# Patient Record
Sex: Male | Born: 1965
Health system: Southern US, Community
[De-identification: ages and names within clinical notes are randomized; demographics above are authoritative.]

## PROBLEM LIST (undated history)

## (undated) DIAGNOSIS — R569 Unspecified convulsions: Secondary | ICD-10-CM

## (undated) DIAGNOSIS — M1990 Unspecified osteoarthritis, unspecified site: Secondary | ICD-10-CM

## (undated) DIAGNOSIS — J45909 Unspecified asthma, uncomplicated: Secondary | ICD-10-CM

## (undated) DIAGNOSIS — E7800 Pure hypercholesterolemia, unspecified: Secondary | ICD-10-CM

## (undated) DIAGNOSIS — I10 Essential (primary) hypertension: Secondary | ICD-10-CM

## (undated) HISTORY — PX: BIOPSY THYROID: PRO38

## (undated) HISTORY — PX: FINGER AMPUTATION: SHX636

---

## 2002-11-05 ENCOUNTER — Emergency Department (HOSPITAL_COMMUNITY): Admission: EM | Admit: 2002-11-05 | Discharge: 2002-11-05 | Payer: Self-pay

## 2003-01-25 ENCOUNTER — Emergency Department (HOSPITAL_COMMUNITY): Admission: EM | Admit: 2003-01-25 | Discharge: 2003-01-25 | Payer: Self-pay | Admitting: *Deleted

## 2003-08-03 ENCOUNTER — Emergency Department (HOSPITAL_COMMUNITY): Admission: EM | Admit: 2003-08-03 | Discharge: 2003-08-03 | Payer: Self-pay | Admitting: Emergency Medicine

## 2005-04-29 ENCOUNTER — Emergency Department (HOSPITAL_COMMUNITY): Admission: EM | Admit: 2005-04-29 | Discharge: 2005-04-29 | Payer: Self-pay | Admitting: Emergency Medicine

## 2010-11-29 ENCOUNTER — Ambulatory Visit (INDEPENDENT_AMBULATORY_CARE_PROVIDER_SITE_OTHER): Payer: BC Managed Care – PPO | Admitting: Medical

## 2010-11-29 DIAGNOSIS — F17200 Nicotine dependence, unspecified, uncomplicated: Secondary | ICD-10-CM

## 2010-11-29 DIAGNOSIS — IMO0001 Reserved for inherently not codable concepts without codable children: Secondary | ICD-10-CM

## 2010-11-29 DIAGNOSIS — I10 Essential (primary) hypertension: Secondary | ICD-10-CM

## 2010-12-03 ENCOUNTER — Encounter: Payer: Self-pay | Admitting: Medical

## 2010-12-03 ENCOUNTER — Telehealth: Payer: Self-pay | Admitting: *Deleted

## 2010-12-03 NOTE — Telephone Encounter (Signed)
Pt notified of lab results and is scheduled to return on 5-11.2012 for follow up.  CM, LPN

## 2010-12-03 NOTE — Telephone Encounter (Signed)
Message copied by Ellsworth Lennox on Tue Dec 03, 2010 12:30 PM ------      Message from: [place], DAVID      Created: Tue Dec 03, 2010  8:56 AM       Labs look fine, blood count, liver, kidney, lytes, thyroid ok.  Cholesterol overall is ok with some minor abnormalities.  Lets see him back as planned in 1wk for recheck on BP, new BP medication, and discuss results.

## 2010-12-04 ENCOUNTER — Encounter: Payer: Self-pay | Admitting: Medical

## 2010-12-05 ENCOUNTER — Encounter: Payer: Self-pay | Admitting: Medical

## 2010-12-06 ENCOUNTER — Ambulatory Visit: Payer: BC Managed Care – PPO | Admitting: Medical

## 2011-12-17 ENCOUNTER — Emergency Department (HOSPITAL_COMMUNITY)
Admission: EM | Admit: 2011-12-17 | Discharge: 2011-12-18 | Disposition: A | Discharge status: Home / Self Care | Payer: BC Managed Care – PPO | Attending: Emergency Medicine | Admitting: Emergency Medicine

## 2011-12-17 ENCOUNTER — Encounter (HOSPITAL_COMMUNITY): Payer: Self-pay | Admitting: *Deleted

## 2011-12-17 DIAGNOSIS — F17200 Nicotine dependence, unspecified, uncomplicated: Secondary | ICD-10-CM | POA: Insufficient documentation

## 2011-12-17 DIAGNOSIS — M542 Cervicalgia: Secondary | ICD-10-CM | POA: Insufficient documentation

## 2011-12-17 DIAGNOSIS — M25519 Pain in unspecified shoulder: Secondary | ICD-10-CM | POA: Insufficient documentation

## 2011-12-17 DIAGNOSIS — S68118A Complete traumatic metacarpophalangeal amputation of other finger, initial encounter: Secondary | ICD-10-CM | POA: Insufficient documentation

## 2011-12-17 DIAGNOSIS — M79609 Pain in unspecified limb: Secondary | ICD-10-CM | POA: Insufficient documentation

## 2011-12-17 DIAGNOSIS — M546 Pain in thoracic spine: Secondary | ICD-10-CM | POA: Insufficient documentation

## 2011-12-17 MED ORDER — LIDOCAINE 5 % EX PTCH
1.0000 | MEDICATED_PATCH | CUTANEOUS | Status: DC
  Administered 2011-12-18: 1 via TRANSDERMAL
  Filled 2011-12-17: qty 1

## 2011-12-17 MED ORDER — TRAMADOL HCL 50 MG PO TABS
50.0000 mg | ORAL_TABLET | Freq: Once | ORAL | Status: AC
  Administered 2011-12-17: 50 mg via ORAL
  Filled 2011-12-17: qty 1

## 2011-12-17 NOTE — ED Notes (Signed)
C/o R neck and shoulder pain, nagging, constant, worse with movement, (denies: sob, numbness/ tingling, weakness, nvd, fever or other sx). Onset Sunday. "Not getting better, but not worse", no relief with advil or ASA.   No known injury.

## 2011-12-18 ENCOUNTER — Emergency Department (HOSPITAL_COMMUNITY): Payer: BC Managed Care – PPO

## 2011-12-18 MED ORDER — TRAMADOL HCL 50 MG PO TABS: 50.0000 mg | ORAL_TABLET | Freq: Once | ORAL | Status: AC

## 2011-12-18 MED ORDER — LIDOCAINE 5 % EX PTCH: 1.0000 | MEDICATED_PATCH | CUTANEOUS | Status: AC

## 2011-12-18 NOTE — Discharge Instructions (Signed)
Your pain is very nonspecific.  Your x-ray is normal.  You have been given Ultram by mouth every 6 hours as well as Lidoderm patches to apply 1 every 24 hours.  Followup with your doctor for further evaluation

## 2011-12-18 NOTE — ED Provider Notes (Signed)
History     CSN: [id number]  Arrival date & time 12/17/11  2250   First MD Initiated Contact with Patient 12/17/11 2359      Chief Complaint  Patient presents with  . Neck Pain    (Consider location/radiation/quality/duration/timing/severity/associated sxs/prior treatment) HPI Comments: Is doing very vague complaints of right shoulder and neck arm pain, he cannot turn you specifically where this pain is, but it's stabbing in nature, and constant, can not pinpoint where the pain is, denies any trauma, recent illnesses, MVC, shortness of breath, chest pain, diaphoresis, nausea  Patient is a 46 y.o. male presenting with neck pain. The history is provided by the patient.  Neck Pain  This is a new problem. The current episode started more than 2 days ago. The problem occurs constantly. The problem has not changed since onset.Pertinent negatives include no numbness, no headaches and no weakness.    History reviewed. No pertinent past medical history.  Past Surgical History  Procedure Date  . Finger amputation     No family history on file.  History  Substance Use Topics  . Smoking status: Current Everyday Smoker -- 0.5 packs/day  . Smokeless tobacco: Not on file  . Alcohol Use: Yes     beer occasionally      Review of Systems  Constitutional: Negative for fever and chills.  HENT: Positive for neck pain. Negative for ear pain, congestion, sore throat, rhinorrhea, trouble swallowing, neck stiffness and ear discharge.   Respiratory: Negative for shortness of breath.   Gastrointestinal: Negative for nausea.  Musculoskeletal: Negative for myalgias and back pain.  Neurological: Negative for dizziness, weakness, numbness and headaches.    Allergies  Review of patient's allergies indicates no known allergies.  Home Medications   Current Outpatient Rx  Name Route Sig Dispense Refill  . ASPIRIN EC 81 MG PO TBEC Oral Take 81-162 mg by mouth 3 (three) times daily as needed. For  pain    . LIDOCAINE 5 % EX PTCH Transdermal Place 1 patch onto the skin daily. Remove & Discard patch within 12 hours or as directed by MD 10 patch 0  . TRAMADOL HCL 50 MG PO TABS Oral Take 1 tablet (50 mg total) by mouth once. 30 tablet 0    BP 151/99  Pulse 62  Temp(Src) 97.6 F (36.4 C) (Oral)  Resp 19  SpO2 90%  Physical Exam  Constitutional: He is oriented to person, place, and time. He appears well-developed and well-nourished.  HENT:  Head: Normocephalic.  Eyes: Pupils are equal, round, and reactive to light.  Neck: Normal range of motion. Neck supple. No spinous process tenderness and no muscular tenderness present. No rigidity. No edema, no erythema and normal range of motion present.  Cardiovascular: Normal rate.   Pulmonary/Chest: Effort normal.  Musculoskeletal: Normal range of motion.       Is no rash.  There are no lesions in is no discoloration.  Patient has no swelling to the neck, shoulder, back, and right arm.  Pulses are strong distally, regular.  Full range of motion at the shoulder, elbow and wrist.  Can turn head from side to side without eliciting pain  Neurological: He is alert and oriented to person, place, and time.  Skin: Skin is warm.    ED Course  Procedures (including critical care time)  Labs Reviewed - No data to display Dg Thoracic Spine 2 View  12/18/2011  *RADIOLOGY REPORT*  Clinical Data: Upper thoracic pain  THORACIC SPINE -  2 VIEW  Comparison: None.  Findings: The imaged vertebral bodies and inter-vertebral disc spaces are maintained. No displaced acute fracture or dislocation identified.   The para-vertebral and overlying soft tissues are within normal limits.  Visualized portions of the lungs are clear.  IMPRESSION: No acute osseous abnormality identified of the thoracic spine.  Original Report Authenticated By: Waneta Martins, M.D.     1. Shoulder pain, unspecified laterality       MDM  Patient is having vague nonreproducible pain  on the right side of his neck, maybe the top of his shoulder        Arman Filter, NP 12/18/11 1191

## 2011-12-21 NOTE — ED Provider Notes (Signed)
Medical screening examination/treatment/procedure(s) were performed by non-physician practitioner and as supervising physician I was immediately available for consultation/collaboration.   Laray Anger, DO 12/21/11 1225

## 2012-01-19 ENCOUNTER — Encounter (HOSPITAL_COMMUNITY): Payer: Self-pay | Admitting: *Deleted

## 2012-01-19 ENCOUNTER — Emergency Department (HOSPITAL_COMMUNITY)
Admission: EM | Admit: 2012-01-19 | Discharge: 2012-01-19 | Disposition: A | Discharge status: Home / Self Care | Payer: BC Managed Care – PPO | Attending: Emergency Medicine | Admitting: Emergency Medicine

## 2012-01-19 DIAGNOSIS — F17200 Nicotine dependence, unspecified, uncomplicated: Secondary | ICD-10-CM | POA: Insufficient documentation

## 2012-01-19 DIAGNOSIS — R51 Headache: Secondary | ICD-10-CM

## 2012-01-19 HISTORY — DX: Essential (primary) hypertension: I10

## 2012-01-19 MED ORDER — HYDROCODONE-ACETAMINOPHEN 5-325 MG PO TABS: 1.0000 | ORAL_TABLET | ORAL | Status: AC | PRN

## 2012-01-19 NOTE — ED Notes (Signed)
Pt to ED c/o increasing frequency, but not intensity, of her headaches in the last few months.  Pt denies nausea, but states he is photophobic.

## 2012-01-19 NOTE — ED Provider Notes (Signed)
History     CSN: [id number]  Arrival date & time 01/19/12  0129   First MD Initiated Contact with Patient 01/19/12 0208      Chief Complaint  Patient presents with  . Headache    GENERALIZED HEAD    (Consider location/radiation/quality/duration/timing/severity/associated sxs/prior treatment) HPI Comments: Patient who had been drinking earlier in the evening presents tonight with headache - he states that the headache started about 9pm - states that it is global and not associated with blurred vision, nausea, vomiting, fever, chills, neck stiffness, syncope, weakness, numbness or tingling.  He states that he has tried motrin for the pain without relief.  Patient is a 46 y.o. male presenting with headaches. The history is provided by the patient. No language interpreter was used.  Headache  This is a new problem. The current episode started 6 to 12 hours ago. The problem occurs constantly. The problem has not changed since onset.The headache is associated with nothing. Pain location: diffuse. The quality of the pain is described as throbbing. The pain is at a severity of 5/10. The pain is moderate. The pain does not radiate. Pertinent negatives include no anorexia, no fever, no malaise/fatigue, no chest pressure, no near-syncope, no orthopnea, no palpitations, no syncope, no shortness of breath, no nausea and no vomiting. He has tried NSAIDs for the symptoms. The treatment provided no relief.    Past Medical History  Diagnosis Date  . Hypertension     Past Surgical History  Procedure Date  . Finger amputation     No family history on file.  History  Substance Use Topics  . Smoking status: Current Everyday Smoker -- 0.5 packs/day  . Smokeless tobacco: Not on file  . Alcohol Use: Yes     beer occasionally      Review of Systems  Constitutional: Negative for fever, chills and malaise/fatigue.  HENT: Negative for ear pain, neck pain and neck stiffness.   Eyes: Negative for  photophobia, pain and visual disturbance.  Respiratory: Negative for chest tightness and shortness of breath.   Cardiovascular: Negative for chest pain, palpitations, orthopnea, syncope and near-syncope.  Gastrointestinal: Negative for nausea, vomiting and anorexia.  Genitourinary: Negative for dysuria and flank pain.  Musculoskeletal: Negative for back pain.  Neurological: Positive for headaches. Negative for dizziness, tremors, syncope and numbness.  All other systems reviewed and are negative.    Allergies  Review of patient's allergies indicates no known allergies.  Home Medications   Current Outpatient Rx  Name Route Sig Dispense Refill  . IBUPROFEN 200 MG PO TABS Oral Take 400 mg by mouth every 6 (six) hours as needed. For pain      BP 123/74  Pulse 67  Temp 97.7 F (36.5 C) (Oral)  Resp 20  SpO2 97%  Physical Exam  Nursing note and vitals reviewed. Constitutional: He is oriented to person, place, and time. He appears well-developed and well-nourished. No distress.  HENT:  Head: Normocephalic and atraumatic.  Right Ear: External ear normal.  Left Ear: External ear normal.  Nose: Nose normal.  Mouth/Throat: Oropharynx is clear and moist. No oropharyngeal exudate.  Eyes: Conjunctivae are normal. Pupils are equal, round, and reactive to light. No scleral icterus.  Neck: Normal range of motion. Neck supple. No Brudzinski's sign and no Kernig's sign noted.  Cardiovascular: Normal rate, regular rhythm and normal heart sounds.  Exam reveals no gallop and no friction rub.   No murmur heard. Pulmonary/Chest: Effort normal and breath sounds normal. No  respiratory distress. He has no wheezes. He has no rales. He exhibits no tenderness.  Abdominal: Soft. Bowel sounds are normal. He exhibits no distension. There is no tenderness.  Musculoskeletal: Normal range of motion. He exhibits no edema and no tenderness.  Lymphadenopathy:    He has no cervical adenopathy.  Neurological:  He is alert and oriented to person, place, and time. He has normal strength and normal reflexes. He displays no tremor and normal reflexes. No cranial nerve deficit or sensory deficit. He exhibits normal muscle tone. He displays a negative Romberg sign. Coordination and gait normal. GCS eye subscore is 4. GCS verbal subscore is 5. GCS motor subscore is 6.  Skin: Skin is warm and dry. No rash noted. No erythema. No pallor.  Psychiatric: He has a normal mood and affect. His behavior is normal. Judgment and thought content normal.    ED Course  Procedures (including critical care time)  Labs Reviewed - No data to display No results found.   Headache   MDM  Patient here with vague global headache - there is no neurological deficit noted, and I do not feel that imaging is necessary - completely normal exam - blood pressure mildly elevated and patient just went to optomotrist - no eye pain, I doubt acute glaucoma, migraine headache, meningitis.        Izola Price [place], [place] 01/19/12 [phone]

## 2012-01-19 NOTE — ED Provider Notes (Signed)
Medical screening examination/treatment/procedure(s) were performed by non-physician practitioner and as supervising physician I was immediately available for consultation/collaboration.  Jasmine Awe, MD 01/19/12 [phone]

## 2012-01-19 NOTE — Discharge Instructions (Signed)
Headaches, Frequently Asked Questions MIGRAINE HEADACHES Q: What is migraine? What causes it? How can I treat it? A: Generally, migraine headaches begin as a dull ache. Then they develop into a constant, throbbing, and pulsating pain. You may experience pain at the temples. You may experience pain at the front or back of one or both sides of the head. The pain is usually accompanied by a combination of:  Nausea.   Vomiting.   Sensitivity to light and noise.  Some people (about 15%) experience an aura (see below) before an attack. The cause of migraine is believed to be chemical reactions in the brain. Treatment for migraine may include over-the-counter or prescription medications. It may also include self-help techniques. These include relaxation training and biofeedback.  Q: What is an aura? A: About 15% of people with migraine get an "aura". This is a sign of neurological symptoms that occur before a migraine headache. You may see wavy or jagged lines, dots, or flashing lights. You might experience tunnel vision or blind spots in one or both eyes. The aura can include visual or auditory hallucinations (something imagined). It may include disruptions in smell (such as strange odors), taste or touch. Other symptoms include:  Numbness.   A "pins and needles" sensation.   Difficulty in recalling or speaking the correct word.  These neurological events may last as long as 60 minutes. These symptoms will fade as the headache begins. Q: What is a trigger? A: Certain physical or environmental factors can lead to or "trigger" a migraine. These include:  Foods.   Hormonal changes.   Weather.   Stress.  It is important to remember that triggers are different for everyone. To help prevent migraine attacks, you need to figure out which triggers affect you. Keep a headache diary. This is a good way to track triggers. The diary will help you talk to your healthcare professional about your  condition. Q: Does weather affect migraines? A: Bright sunshine, hot, humid conditions, and drastic changes in barometric pressure may lead to, or "trigger," a migraine attack in some people. But studies have shown that weather does not act as a trigger for everyone with migraines. Q: What is the link between migraine and hormones? A: Hormones start and regulate many of your body's functions. Hormones keep your body in balance within a constantly changing environment. The levels of hormones in your body are unbalanced at times. Examples are during menstruation, pregnancy, or menopause. That can lead to a migraine attack. In fact, about three quarters of all women with migraine report that their attacks are related to the menstrual cycle.  Q: Is there an increased risk of stroke for migraine sufferers? A: The likelihood of a migraine attack causing a stroke is very remote. That is not to say that migraine sufferers cannot have a stroke associated with their migraines. In persons under age 40, the most common associated factor for stroke is migraine headache. But over the course of a person's normal life span, the occurrence of migraine headache may actually be associated with a reduced risk of dying from cerebrovascular disease due to stroke.  Q: What are acute medications for migraine? A: Acute medications are used to treat the pain of the headache after it has started. Examples over-the-counter medications, NSAIDs, ergots, and triptans.  Q: What are the triptans? A: Triptans are the newest class of abortive medications. They are specifically targeted to treat migraine. Triptans are vasoconstrictors. They moderate some chemical reactions in the brain.   The triptans work on receptors in your brain. Triptans help to restore the balance of a neurotransmitter called serotonin. Fluctuations in levels of serotonin are thought to be a main cause of migraine.  Q: Are over-the-counter medications for migraine  effective? A: Over-the-counter, or "OTC," medications may be effective in relieving mild to moderate pain and associated symptoms of migraine. But you should see your caregiver before beginning any treatment regimen for migraine.  Q: What are preventive medications for migraine? A: Preventive medications for migraine are sometimes referred to as "prophylactic" treatments. They are used to reduce the frequency, severity, and length of migraine attacks. Examples of preventive medications include antiepileptic medications, antidepressants, beta-blockers, calcium channel blockers, and NSAIDs (nonsteroidal anti-inflammatory drugs). Q: Why are anticonvulsants used to treat migraine? A: During the past few years, there has been an increased interest in antiepileptic drugs for the prevention of migraine. They are sometimes referred to as "anticonvulsants". Both epilepsy and migraine may be caused by similar reactions in the brain.  Q: Why are antidepressants used to treat migraine? A: Antidepressants are typically used to treat people with depression. They may reduce migraine frequency by regulating chemical levels, such as serotonin, in the brain.  Q: What alternative therapies are used to treat migraine? A: The term "alternative therapies" is often used to describe treatments considered outside the scope of conventional Western medicine. Examples of alternative therapy include acupuncture, acupressure, and yoga. Another common alternative treatment is herbal therapy. Some herbs are believed to relieve headache pain. Always discuss alternative therapies with your caregiver before proceeding. Some herbal products contain arsenic and other toxins. TENSION HEADACHES Q: What is a tension-type headache? What causes it? How can I treat it? A: Tension-type headaches occur randomly. They are often the result of temporary stress, anxiety, fatigue, or anger. Symptoms include soreness in your temples, a tightening  band-like sensation around your head (a "vice-like" ache). Symptoms can also include a pulling feeling, pressure sensations, and contracting head and neck muscles. The headache begins in your forehead, temples, or the back of your head and neck. Treatment for tension-type headache may include over-the-counter or prescription medications. Treatment may also include self-help techniques such as relaxation training and biofeedback. CLUSTER HEADACHES Q: What is a cluster headache? What causes it? How can I treat it? A: Cluster headache gets its name because the attacks come in groups. The pain arrives with little, if any, warning. It is usually on one side of the head. A tearing or bloodshot eye and a runny nose on the same side of the headache may also accompany the pain. Cluster headaches are believed to be caused by chemical reactions in the brain. They have been described as the most severe and intense of any headache type. Treatment for cluster headache includes prescription medication and oxygen. SINUS HEADACHES Q: What is a sinus headache? What causes it? How can I treat it? A: When a cavity in the bones of the face and skull (a sinus) becomes inflamed, the inflammation will cause localized pain. This condition is usually the result of an allergic reaction, a tumor, or an infection. If your headache is caused by a sinus blockage, such as an infection, you will probably have a fever. An x-ray will confirm a sinus blockage. Your caregiver's treatment might include antibiotics for the infection, as well as antihistamines or decongestants.  REBOUND HEADACHES Q: What is a rebound headache? What causes it? How can I treat it? A: A pattern of taking acute headache medications too   often can lead to a condition known as "rebound headache." A pattern of taking too much headache medication includes taking it more than 2 days per week or in excessive amounts. That means more than the label or a caregiver advises.  With rebound headaches, your medications not only stop relieving pain, they actually begin to cause headaches. Doctors treat rebound headache by tapering the medication that is being overused. Sometimes your caregiver will gradually substitute a different type of treatment or medication. Stopping may be a challenge. Regularly overusing a medication increases the potential for serious side effects. Consult a caregiver if you regularly use headache medications more than 2 days per week or more than the label advises. ADDITIONAL QUESTIONS AND ANSWERS Q: What is biofeedback? A: Biofeedback is a self-help treatment. Biofeedback uses special equipment to monitor your body's involuntary physical responses. Biofeedback monitors:  Breathing.   Pulse.   Heart rate.   Temperature.   Muscle tension.   Brain activity.  Biofeedback helps you refine and perfect your relaxation exercises. You learn to control the physical responses that are related to stress. Once the technique has been mastered, you do not need the equipment any more. Q: Are headaches hereditary? A: Four out of five (80%) of people that suffer report a family history of migraine. Scientists are not sure if this is genetic or a family predisposition. Despite the uncertainty, a child has a 50% chance of having migraine if one parent suffers. The child has a 75% chance if both parents suffer.  Q: Can children get headaches? A: By the time they reach high school, most young people have experienced some type of headache. Many safe and effective approaches or medications can prevent a headache from occurring or stop it after it has begun.  Q: What type of doctor should I see to diagnose and treat my headache? A: Start with your primary caregiver. Discuss his or her experience and approach to headaches. Discuss methods of classification, diagnosis, and treatment. Your caregiver may decide to recommend you to a headache specialist, depending upon  your symptoms or other physical conditions. Having diabetes, allergies, etc., may require a more comprehensive and inclusive approach to your headache. The National Headache Foundation will provide, upon request, a list of [facility] physician members in your state. Document Released: 10/04/2003 Document Revised: 07/03/2011 Document Reviewed: 03/13/2008 [facility] Patient Information 2012 [place], [place].Headache, General, Unknown Cause The specific cause of your headache may not have been found today. There are many causes and types of headache. A few common ones are:  Tension headache.   Migraine.   Infections (examples: dental and sinus infections).   Bone and/or joint problems in the neck or jaw.   Depression.   Eye problems.  These headaches are not life threatening.  Headaches can sometimes be diagnosed by a patient history and a physical exam. Sometimes, lab and imaging studies (such as x-ray and/or CT scan) are used to rule out more serious problems. In some cases, a spinal tap (lumbar puncture) may be requested. There are many times when your exam and tests may be normal on the first visit even when there is a serious problem causing your headaches. Because of that, it is very important to follow up with your doctor or local clinic for further evaluation. FINDING OUT THE RESULTS OF TESTS  If a radiology test was performed, a radiologist will review your results.   You will be contacted by the emergency department or your physician if any test results  require a change in your treatment plan.   Not all test results may be available during your visit. If your test results are not back during the visit, make an appointment with your caregiver to find out the results. Do not assume everything is normal if you have not heard from your caregiver or the medical facility. It is important for you to follow up on all of your test results.  HOME CARE INSTRUCTIONS   Keep follow-up appointments with  your caregiver, or any specialist referral.   Only take over-the-counter or prescription medicines for pain, discomfort, or fever as directed by your caregiver.   Biofeedback, massage, or other relaxation techniques may be helpful.   Ice packs or heat applied to the head and neck can be used. Do this three to four times per day, or as needed.   Call your doctor if you have any questions or concerns.   If you smoke, you should quit.  SEEK MEDICAL CARE IF:   You develop problems with medications prescribed.   You do not respond to or obtain relief from medications.   You have a change from the usual headache.   You develop nausea or vomiting.  SEEK IMMEDIATE MEDICAL CARE IF:   If your headache becomes severe.   You have an unexplained oral temperature above 102 F (38.9 C), or as your caregiver suggests.   You have a stiff neck.   You have loss of vision.   You have muscular weakness.   You have loss of muscular control.   You develop severe symptoms different from your first symptoms.   You start losing your balance or have trouble walking.   You feel faint or pass out.  MAKE SURE YOU:   Understand these instructions.   Will watch your condition.   Will get help right away if you are not doing well or get worse.  Document Released: 07/14/2005 Document Revised: 07/03/2011 Document Reviewed: 03/02/2008 [facility] Patient Information 2012 [place], [place].

## 2012-01-19 NOTE — ED Notes (Addendum)
Pt reports headache that started last PM over generalized head denies blurred vision, nausea, or syncope neuro grossly intact

## 2012-03-04 ENCOUNTER — Ambulatory Visit (INDEPENDENT_AMBULATORY_CARE_PROVIDER_SITE_OTHER): Payer: BC Managed Care – PPO | Admitting: General Surgery

## 2012-03-17 ENCOUNTER — Encounter (INDEPENDENT_AMBULATORY_CARE_PROVIDER_SITE_OTHER): Payer: Self-pay | Admitting: General Surgery

## 2012-03-17 ENCOUNTER — Ambulatory Visit (INDEPENDENT_AMBULATORY_CARE_PROVIDER_SITE_OTHER): Payer: BC Managed Care – PPO | Admitting: General Surgery

## 2012-03-17 VITALS — BP 150/100 | HR 88 | Temp 98.3°F | Resp 16 | Ht 70.0 in | Wt 153.8 lb

## 2012-03-17 DIAGNOSIS — E042 Nontoxic multinodular goiter: Secondary | ICD-10-CM | POA: Insufficient documentation

## 2012-03-17 NOTE — Patient Instructions (Signed)
Thyroid Diseases Your thyroid is a butterfly-shaped gland in your neck. It is located just above your collarbone. It is one of your endocrine glands, which make hormones. The thyroid helps set your metabolism. Metabolism is how your body gets energy from the foods you eat.  Millions of people have thyroid diseases. Women experience thyroid problems more often than men. In fact, overactive thyroid problems (hyperthyroidism) occur in 1% of all women. If you have a thyroid disease, your body may use energy more slowly or quickly than it should.  Thyroid problems also include an immune disease where your body reacts against your thyroid gland (called thyroiditis). A different problem involves lumps and bumps (called nodules) that develop in the gland. The nodules are usually, but not always, noncancerous. THE MOST COMMON THYROID PROBLEMS AND CAUSES ARE DISCUSSED BELOW There are many causes for thyroid problems. Treatment depends upon the exact diagnosis and includes trying to reset your body's metabolism to a normal rate. Hyperthyroidism Too much thyroid hormone from an overactive thyroid gland is called hyperthyroidism. In hyperthyroidism, the body's metabolism speeds up. One of the most frequent forms of hyperthyroidism is known as Graves' disease. Graves' disease tends to run in families. Although Graves' is thought to be caused by a problem with the immune system, the exact nature of the genetic problem is unknown. Hypothyroidism Too little thyroid hormone from an underactive thyroid gland is called hypothyroidism. In hypothyroidism, the body's metabolism is slowed. Several things can cause this condition. Most causes affect the thyroid gland directly and hurt its ability to make enough hormone.  Rarely, there may be a pituitary gland tumor (located near the base of the brain). The tumor can block the pituitary from producing thyroid-stimulating hormone (TSH). Your body makes TSH to stimulate the thyroid  to work properly. If the pituitary does not make enough TSH, the thyroid fails to make enough hormones needed for good health. Whether the problem is caused by thyroid conditions or by the pituitary gland, the result is that the thyroid is not making enough hormones. Hypothyroidism causes many physical and mental processes to become sluggish. The body consumes less oxygen and produces less body heat. Thyroid Nodules A thyroid nodule is a small swelling or lump in the thyroid gland. They are common. These nodules represent either a growth of thyroid tissue or a fluid-filled cyst. Both form a lump in the thyroid gland. Almost half of all people will have tiny thyroid nodules at some point in their lives. Typically, these are not noticeable until they become large and affect normal thyroid size. Larger nodules that are greater than a half inch across (about 1 centimeter) occur in about 5 percent of people. Although most nodules are not cancerous, people who have them should seek medical care to rule out cancer. Also, some thyroid nodules may produce too much thyroid hormone or become too large. Large nodules or a large gland can interfere with breathing or swallowing or may cause neck discomfort. Other problems Other thyroid problems include cancer and thyroiditis. Thyroiditis is a malfunction of the body's immune system. Normally, the immune system works to defend the body against infection and other problems. When the immune system is not working properly, it may mistakenly attack normal cells, tissues, and organs. Examples of autoimmune diseases are Hashimoto's thyroiditis (which causes low thyroid function) and Graves' disease (which causes excess thyroid function). SYMPTOMS  Symptoms vary greatly depending upon the exact type of problem with the thyroid. Hyperthyroidism-is when your thyroid is too   active and makes more thyroid hormone than your body needs. The most common cause is Graves' Disease. Too  much thyroid hormone can cause some or all of the following symptoms:  Anxiety.   Irritability.   Difficulty sleeping.   Fatigue.   A rapid or irregular heartbeat.   A fine tremor of your hands or fingers.   An increase in perspiration.   Sensitivity to heat.   Weight loss, despite normal food intake.   Brittle hair.   Enlargement of your thyroid gland (goiter).   Light menstrual periods.   Frequent bowel movements.  Graves' disease can specifically cause eye and skin problems. The skin problems involve reddening and swelling of the skin, often on your shins and on the top of your feet. Eye problems can include the following:  Excess tearing and sensation of grit or sand in either or both eyes.   Reddened or inflamed eyes.   Widening of the space between your eyelids.   Swelling of the lids and tissues around the eyes.   Light sensitivity.   Ulcers on the cornea.   Double vision.   Limited eye movements.   Blurred or reduced vision.  Hypothyroidism- is when your thyroid gland is not active enough. This is more common than hyperthyroidism. Symptoms can vary a lot depending of the severity of the hormone deficiency. Symptoms may develop over a long period of time and can include several of the following:  Fatigue.   Sluggishness.   Increased sensitivity to cold.   Constipation.   Pale, dry skin.   A puffy face.   Hoarse voice.   High blood cholesterol level.   Unexplained weight gain.   Muscle aches, tenderness and stiffness.   Pain, stiffness or swelling in your joints.   Muscle weakness.   Heavier than normal menstrual periods.   Brittle fingernails and hair.   Depression.  Thyroid Nodules - most do not cause signs or symptoms. Occasionally, some may become so large that you can feel or even see the swelling at the base of your neck. You may realize a lump or swelling is there when you are shaving or putting on makeup. Men might become  aware of a nodule when shirt collars suddenly feel too tight. Some nodules produce too much thyroid hormone. This can produce the same symptoms as hyperthyroidism (see above). Thyroid nodules are seldom cancerous. However, a nodule is more likely to be malignant (cancerous) if it:  Grows quickly or feels hard.   Causes you to become hoarse or to have trouble swallowing or breathing.   Causes enlarged lymph nodes under your jaw or in your neck.  DIAGNOSIS  Because there are so many possible thyroid conditions, your caregiver may ask for a number of tests. They will do this in order to narrow down the exact diagnosis. These tests can include:  Blood and antibody tests.   Special thyroid scans using small, safe amounts of radioactive iodine.   Ultrasound of the thyroid gland (particularly if there is a nodule or lump).   Biopsy. This is usually done with a special needle. A needle biopsy is a procedure to obtain a sample of cells from the thyroid. The tissue will be tested in a lab and examined under a microscope.  TREATMENT  Treatment depends on the exact diagnosis. Hyperthyroidism  Beta-blockers help relieve many of the symptoms.   Anti-thyroid medications prevent the thyroid from making excess hormones.   Radioactive iodine treatment can destroy overactive thyroid   cells. The iodine can permanently decrease the amount of hormone produced.   Surgery to remove the thyroid gland.   Treatments for eye problems that come from Graves' disease also include medications and special eye surgery, if felt to be appropriate.  Hypothyroidism Thyroid replacement with levothyroxine is the mainstay of treatment. Treatment with thyroid replacement is usually lifelong and will require monitoring and adjustment from time to time. Thyroid Nodules  Watchful waiting. If a small nodule causes no symptoms or signs of cancer on biopsy, then no treatment may be chosen at first. Re-exam and re-checking blood  tests would be the recommended follow-up.   Anti-thyroid medications or radioactive iodine treatment may be recommended if the nodules produce too much thyroid hormone (see Treatment for Hyperthyroidism above).   Alcohol ablation. Injections of small amounts of ethyl alcohol (ethanol) can cause a non-cancerous nodule to shrink in size.   Surgery (see Treatment for Hyperthyroidism above).  HOME CARE INSTRUCTIONS   Take medications as instructed.   Follow through on recommended testing.  SEEK MEDICAL CARE IF:   You feel that you are developing symptoms of Hyperthyroidism or Hypothyroidism as described above.   You develop a new lump/nodule in the neck/thyroid area that you had not noticed before.   You feel that you are having side effects from medicines prescribed.   You develop trouble breathing or swallowing.  SEEK IMMEDIATE MEDICAL CARE IF:   You develop a fever of 102 F (38.9 C) or higher.   You develop severe sweating.   You develop palpitations and/or rapid heart beat.   You develop shortness of breath.   You develop nausea and vomiting.   You develop extreme shakiness.   You develop agitation.   You develop lightheadedness or have a fainting episode.  Document Released: 05/11/2007 Document Revised: 07/03/2011 Document Reviewed: 05/11/2007 [employer] Patient Information 2012 [employer]. 

## 2012-03-17 NOTE — Progress Notes (Signed)
Patient ID: Charles Ruiz, male   DOB: 1966-01-08, 46 y.o.   MRN: [id number]  Chief Complaint  Patient presents with  . Pre-op Exam    thyroid nodule    HPI Charles Ruiz is a 46 y.o. male.   HPI 46 yo AAM referred by Ms. Millsaps, NP, for evaluation of the thyroid nodules. The patient states that he noticed a lump in his neck for the past 9 months. He states that it hasn't really changed in size or nature. He denies any neck pain. He denies any difficulty swallowing liquids or solids. He denies any pain when swallowing or eating. He denies any voice changes . He denies any weight loss. He denies any lymphadenopathy. He denies any palpitation, diarrhea, constipation. He denies any prior neck or chest radiation. He denies any family history or personal history of thyroid problems. He denies any family history of cancer. He states that he did have a paternal uncle pass away from cancer, but he cannot recall what type of cancer.  He does smoke about a pack and a half per day. He has been smoking for about 10 years.  Past Medical History  Diagnosis Date  . Hypertension     Past Surgical History  Procedure Date  . Finger amputation     Family History  Problem Relation Age of Onset  . Hypertension Mother   . Hypertension Sister   . Hypertension Brother     Social History History  Substance Use Topics  . Smoking status: Current Everyday Smoker -- 1.5 packs/day  . Smokeless tobacco: Never Used  . Alcohol Use: Yes     beer occasionally    No Known Allergies  Current Outpatient Prescriptions  Medication Sig Dispense Refill  . ibuprofen (ADVIL,MOTRIN) 200 MG tablet Take 400 mg by mouth every 6 (six) hours as needed. For pain      . lisinopril-hydrochlorothiazide (PRINZIDE,ZESTORETIC) 10-12.5 MG per tablet Take 1 tablet by mouth daily.        Review of Systems Review of Systems  Constitutional: Negative for fever, chills, appetite change and unexpected weight change.  HENT: Negative  for hearing loss, congestion, trouble swallowing and neck pain.   Eyes: Negative for visual disturbance.  Respiratory: Negative for chest tightness and shortness of breath.   Cardiovascular: Negative for chest pain and leg swelling.       No PND, no orthopnea, no DOE  Gastrointestinal: Negative for diarrhea and constipation.       See HPI  Genitourinary: Negative for dysuria and hematuria.  Musculoskeletal: Negative.   Skin: Negative for rash.  Neurological: Negative for seizures and speech difficulty.  Hematological: Negative for adenopathy. Does not bruise/bleed easily.  Psychiatric/Behavioral: Negative for suicidal ideas, behavioral problems and confusion. The patient is not nervous/anxious.     Blood pressure 150/100, pulse 88, temperature 98.3 F (36.8 C), temperature source Temporal, resp. rate 16, height 5\' 10"  (1.778 m), weight 153 lb 12.8 oz (69.763 kg).  Physical Exam Physical Exam  Vitals reviewed. Constitutional: He is oriented to person, place, and time. He appears well-developed and well-nourished. No distress.       thin  HENT:  Head: Normocephalic and atraumatic.  Right Ear: External ear normal.  Left Ear: External ear normal.  Nose: Nose normal.  Eyes: Conjunctivae and EOM are normal. Pupils are equal, round, and reactive to light.  Neck: Normal range of motion. Neck supple. No tracheal tenderness present. No tracheal deviation present. Mass present.  No thyromegaly, has a 1cm palpable lump in midportion of thyroid (isthmus). Well circumscribed.   Cardiovascular: Normal rate, regular rhythm and normal heart sounds.   Pulmonary/Chest: Effort normal and breath sounds normal. No stridor. No respiratory distress. He has no wheezes.  Abdominal: Soft. He exhibits no distension.  Musculoskeletal: He exhibits no edema and no tenderness.  Lymphadenopathy:       Head (right side): No submental, no submandibular, no tonsillar, no preauricular, no posterior auricular and  no occipital adenopathy present.       Head (left side): No submental, no submandibular, no tonsillar, no preauricular, no posterior auricular and no occipital adenopathy present.    He has no cervical adenopathy.    He has no axillary adenopathy.       Right: No supraclavicular adenopathy present.       Left: No supraclavicular adenopathy present.  Neurological: He is alert and oriented to person, place, and time. He exhibits normal muscle tone.  Skin: Skin is warm and dry. No rash noted. He is not diaphoretic. No erythema. No pallor.  Psychiatric: He has a normal mood and affect. His behavior is normal. Judgment and thought content normal.    Data Reviewed Thyroid ultrasound performed on July 11 showed mild thyroid enlargement with right lobe measuring 2 x 2 x 5.3 cm, left lobe measuring 2.4 x 2 x 5.1 cm and the isthmus have a 1 cm thickness. The palpable nodule corresponds to a 1.3 x 0.8 x 1.3 cm hypervascular solid isthmus mass. A right midlobe nodule measures 0.8 x 0.5 x 0.7 cm size and is solid and hypervascular. A medial left lobe nodule measures 1.1 x 0.8 x 0.7 size and demonstrates minimal increased vascularity. No other solid or cystic nodules are evident.  TSH level 0.76 which was within normal limits  I also reviewed a note from Ms. Fredrik Cove on June 30  Assessment    Multiple bilateral thyroid nodules    Plan    I reviewed the patient's ultrasound with him. He does have bilateral thyroid nodules. He appears to be euthyroid. We discussed the etiology and management of thyroid nodules. We discussed continued observation with a repeat neck ultrasound in 6 months versus proceeding with a fine-needle aspiration under ultrasound guidance. We discussed the pros and cons of each. He was given Agricultural engineer. The nodules are of borderline criteria for biopsy. There is no evidence of microcalcification on the ultrasound. My suspicion for malignancy is low. The patient would like to  defer biopsy for now. He has chosen to have a repeat neck ultrasound done in 6 months time. He was educated on what he should call for in the interim. Followup 6 months with a neck ultrasound prior to appointment  Mary Sella. Andrey Campanile, MD, FACS General, Bariatric, & Minimally Invasive Surgery [facility], [place]        [facility] M 03/17/2012, 11:33 AM

## 2012-08-18 ENCOUNTER — Telehealth (INDEPENDENT_AMBULATORY_CARE_PROVIDER_SITE_OTHER): Payer: Self-pay | Admitting: General Surgery

## 2012-08-18 NOTE — Telephone Encounter (Signed)
Called patient to make him aware we need to schedule a follow up ultrasound of his thyroid. He is scheduled to see Dr Andrey Campanile on 09/16/2012 and needs to have this done prior to appt. To let patient know and see if he would like to schedule this himself or have me schedule it. If he would like to schedule this - to give him the number [phone].

## 2012-08-19 NOTE — Telephone Encounter (Signed)
Spoke with patient and made him aware the need for follow up ultrasound. I gave him the number and he will call to set this up.

## 2012-09-06 ENCOUNTER — Ambulatory Visit
Admission: RE | Admit: 2012-09-06 | Discharge: 2012-09-06 | Disposition: A | Discharge status: Home / Self Care | Payer: BC Managed Care – PPO | Source: Ambulatory Visit | Attending: General Surgery | Admitting: General Surgery

## 2012-09-06 DIAGNOSIS — E042 Nontoxic multinodular goiter: Secondary | ICD-10-CM

## 2012-09-13 ENCOUNTER — Encounter (INDEPENDENT_AMBULATORY_CARE_PROVIDER_SITE_OTHER): Payer: Self-pay

## 2012-09-16 ENCOUNTER — Ambulatory Visit (INDEPENDENT_AMBULATORY_CARE_PROVIDER_SITE_OTHER): Payer: BC Managed Care – PPO | Admitting: General Surgery

## 2012-09-16 ENCOUNTER — Encounter (INDEPENDENT_AMBULATORY_CARE_PROVIDER_SITE_OTHER): Payer: Self-pay | Admitting: General Surgery

## 2012-09-16 VITALS — BP 124/86 | HR 88 | Temp 99.0°F | Resp 18 | Ht 71.0 in | Wt 160.0 lb

## 2012-09-16 DIAGNOSIS — E042 Nontoxic multinodular goiter: Secondary | ICD-10-CM

## 2012-09-16 NOTE — Progress Notes (Signed)
Subjective:     Patient ID: Charles Ruiz, male   DOB: [birth year], 47 y.o.   MRN: [id number]  HPI 47 year old [place] American male comes in for followup for his bilateral thyroid nodules. I initially saw him in August 2013. He was asymptomatic at that time. His nodules were borderline for a biopsy at that time. He agreed to a short interval follow up ultrasound. He comes in today to discuss his recent thyroid ultrasound. He denies any new medical issues. He denies any difficulty swallowing. She feels that the nodule may be a little bit larger. He denies any difficulty swallowing. He denies any voice change. He denies any palpitations. He denies any diarrhea or constipation. He denies any heat or cold intolerance. He denies any weight loss. He denies any swelling around his neck  PMHx, PSHx, SOCHx, FAMHx, ALL reviewed and unchanged  THYROID ULTRASOUND  Technique: Ultrasound examination of the thyroid gland and adjacent  soft tissues was performed.  Comparison: 02/14/2012 by report only  Findings:  Right thyroid lobe: 16 x 23 x 63 mm, homogeneous background  echotexture  Left thyroid lobe: 19 x 21 x 54 mm  Isthmus: 1.8 mm in thickness  Focal nodules: 8 x 14 x 15 mm hypoechoic solid, right isthmus  (previously 8 x 13 x 13 mm by report)  6 x 9 x 10 mm solid, mid-right (previously reported 5 x 7 x 8)  8 x 11 x 13 mm solid, deep mid-left (previously reported 7 x 8 x  11)   Lymphadenopathy: None visualized.   IMPRESSION:  Little change in bilateral thyroid nodules. The dominant right  isthmic lesion meets consensus criteria for biopsy. Ultrasound-  guided fine needle aspiration should be considered, as per the  consensus statement: Management of Thyroid Nodules Detected at [place]:  Society of Radiologists in Ultrasound Consensus Conference  Statement. Radiology 2005; [id number].   Review of Systems 8 point review of systems is performed and all systems are negative except what is mentioned in  history of present illness    Objective:   Physical Exam BP 124/86  Pulse 88  Temp(Src) 99 F (37.2 C) (Temporal)  Resp 18  Ht 5\' 11"  (1.803 m)  Wt 160 lb (72.576 kg)  BMI 22.33 kg/m2  Gen: alert, NAD, non-toxic appearing Pupils: equal, no scleral icterus Pulm: Lungs clear to auscultation, symmetric chest rise CV: regular rate and rhythm Neck: No lymphadenopathy, no thyroid megaly however the patient has a distinct palpable nodule just on the right side of his thyroid. It is well-circumscribed, firm, and nontender. Ext: no edema,  Skin: no rash, no jaundice     Assessment:     Bilateral thyroid nodules    Plan:     Since the right dominant thyroid nodule has increased in size I have recommended proceeding with ultrasound-guided FNA biopsy. I discussed the procedure with him. We discussed the typical risk and benefits. We discussed the typical pathology report. We will set him for an ultrasound-guided FNA biopsy and go from there.   Mary Sella. Andrey Campanile, MD, FACS General, Bariatric, & Minimally Invasive Surgery [facility], [place]

## 2012-09-16 NOTE — Patient Instructions (Signed)
We will get an ultrasound guided biopsy of the largest nodule.

## 2012-09-28 ENCOUNTER — Other Ambulatory Visit (HOSPITAL_COMMUNITY)
Admission: RE | Admit: 2012-09-28 | Discharge: 2012-09-28 | Disposition: A | Discharge status: Home / Self Care | Payer: BC Managed Care – PPO | Source: Ambulatory Visit | Attending: Interventional Radiology | Admitting: Interventional Radiology

## 2012-09-28 ENCOUNTER — Ambulatory Visit
Admission: RE | Admit: 2012-09-28 | Discharge: 2012-09-28 | Disposition: A | Discharge status: Home / Self Care | Payer: BC Managed Care – PPO | Source: Ambulatory Visit | Attending: General Surgery | Admitting: General Surgery

## 2012-09-28 DIAGNOSIS — E049 Nontoxic goiter, unspecified: Secondary | ICD-10-CM | POA: Insufficient documentation

## 2012-09-28 DIAGNOSIS — E042 Nontoxic multinodular goiter: Secondary | ICD-10-CM

## 2012-10-11 ENCOUNTER — Telehealth (INDEPENDENT_AMBULATORY_CARE_PROVIDER_SITE_OTHER): Payer: Self-pay | Admitting: General Surgery

## 2012-10-11 NOTE — Telephone Encounter (Signed)
Left message on machine for patient to call back and ask for me. To make patient aware Dr Andrey Campanile would like to bring him in this week to discuss his test results. Awaiting call back.

## 2012-10-11 NOTE — Telephone Encounter (Signed)
Appt made with patient.  

## 2012-10-14 ENCOUNTER — Ambulatory Visit (INDEPENDENT_AMBULATORY_CARE_PROVIDER_SITE_OTHER): Payer: BC Managed Care – PPO | Admitting: General Surgery

## 2012-10-14 VITALS — BP 140/82 | HR 84 | Resp 16 | Ht 71.0 in | Wt 162.0 lb

## 2012-10-14 DIAGNOSIS — D34 Benign neoplasm of thyroid gland: Secondary | ICD-10-CM

## 2012-10-14 NOTE — Patient Instructions (Signed)
We will bring you back at the end of May to schedule surgery

## 2012-10-15 ENCOUNTER — Encounter (INDEPENDENT_AMBULATORY_CARE_PROVIDER_SITE_OTHER): Payer: Self-pay | Admitting: General Surgery

## 2012-10-15 NOTE — Progress Notes (Signed)
Subjective:     Patient ID: Charles Ruiz, male   DOB: 08-31-1965, 47 y.o.   MRN: [id number]  HPI 47 year old African American male comes in to discuss his FNA biopsy of his thyroid nodule.I last saw him in the office on February 20.He underwent an ultrasound-guided FNA biopsy March 4 of the dominant nodule in the isthmus of his thyroid. It came back as Hurthle cell Lesion and/or neoplasm.  He denies any changes since he was last seen. He states he did well after the biopsy.  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 We discussed Hurthle cell lesions. I explained that is the type of follicular lesion of the thyroid. I explained that it could be a malignant or benign process. Unfortunately the only way to tell for sure whether or not it is malignant or not is to excise it. I explained that malignancy is determined on how the nodule looks on pathology-whether or not there is any invasion.  I explained that there is a possibility that it is a malignant lesion however the majority of these do tend to be benign. We discussed total thyroidectomy versus continued observation. I recommended total thyroidectomy since he has bilateral nodular disease. I explained that if this did come back malignant and we only did a subtotal then he would have to have a completion thyroidectomy and the risk for nerve injury are much higher in a redo procedure. Moreover if we only did a subtotal arterectomy he would have to have continued observation and followup regarding the contralateral  side. Nonetheless the total thyroidectomy would relegate him to being on thyroid replacement hormone for the rest of his life.   We discussed thyroidectomy. We discussed the risk and benefits of surgery including but not limited to bleeding, infection, injury to surrounding structures such as the recurrent laryngeal nerve and superior laryngeal nerve. About possibility of neck hematoma with reexploration. We talked about voice hoarseness and and injury to bilateral laryngeal nerves requiring tracheostomy. I explained that this is an extraordinarily rare complication. We talked about the general postoperative recovery course. We talked about potential injury to the parathyroid glands and needing potential calcium supplementation temporarily postoperatively. We discussed that he would need to take thyroid replacement hormone and would need monitoring of his thyroid hormone after surgery.  Obviously this is a lot process at one visit. He like to think about his options. He is leaning toward surgery but would like to wait until the early summer when his child is out of school. I do not think this is unreasonable to wait for early summer. We will bring him back to the office in late May to schedule surgery for early summer  Charles Ruiz M. Andrey Campanile, MD, FACS General, Bariatric, & Minimally Invasive Surgery [facility], [place]

## 2012-12-24 ENCOUNTER — Encounter (INDEPENDENT_AMBULATORY_CARE_PROVIDER_SITE_OTHER): Payer: BC Managed Care – PPO | Admitting: General Surgery

## 2012-12-29 ENCOUNTER — Encounter (INDEPENDENT_AMBULATORY_CARE_PROVIDER_SITE_OTHER): Payer: Self-pay | Admitting: General Surgery

## 2014-05-15 ENCOUNTER — Encounter (HOSPITAL_COMMUNITY): Payer: Self-pay | Admitting: Emergency Medicine

## 2014-05-15 ENCOUNTER — Emergency Department (INDEPENDENT_AMBULATORY_CARE_PROVIDER_SITE_OTHER)
Admission: EM | Admit: 2014-05-15 | Discharge: 2014-05-15 | Disposition: A | Discharge status: Home / Self Care | Payer: BLUE CROSS/BLUE SHIELD | Source: Home / Self Care | Attending: Family Medicine | Admitting: Family Medicine

## 2014-05-15 DIAGNOSIS — I10 Essential (primary) hypertension: Secondary | ICD-10-CM

## 2014-05-15 LAB — POCT I-STAT, CHEM 8
BUN: 14 mg/dL (ref 6–23)
Calcium, Ion: 1.23 mmol/L (ref 1.12–1.23)
Chloride: 103 mEq/L (ref 96–112)
Creatinine, Ser: 1.2 mg/dL (ref 0.50–1.35)
Glucose, Bld: 101 mg/dL — ABNORMAL HIGH (ref 70–99)
HCT: 50 % (ref 39.0–52.0)
Hemoglobin: 17 g/dL (ref 13.0–17.0)
Potassium: 4.1 mEq/L (ref 3.7–5.3)
Sodium: 141 mEq/L (ref 137–147)
TCO2: 26 mmol/L (ref 0–100)

## 2014-05-15 MED ORDER — LISINOPRIL-HYDROCHLOROTHIAZIDE 10-12.5 MG PO TABS: 1.0000 | ORAL_TABLET | Freq: Every day | ORAL | Status: DC

## 2014-05-15 NOTE — Discharge Instructions (Signed)
Thank you for coming in today. Take lisinopril/hydrochlorothiazide combination pill daily. Followup with primary care provider. Call or go to the emergency room if you get worse, have trouble breathing, have chest pains, or palpitations.    Hypertension Hypertension, commonly called high blood pressure, is when the force of blood pumping through your arteries is too strong. Your arteries are the blood vessels that carry blood from your heart throughout your body. A blood pressure reading consists of a higher number over a lower number, such as 110/72. The higher number (systolic) is the pressure inside your arteries when your heart pumps. The lower number (diastolic) is the pressure inside your arteries when your heart relaxes. Ideally you want your blood pressure below 120/80. Hypertension forces your heart to work harder to pump blood. Your arteries may become narrow or stiff. Having hypertension puts you at risk for heart disease, stroke, and other problems.  RISK FACTORS Some risk factors for high blood pressure are controllable. Others are not.  Risk factors you cannot control include:   Race. You may be at higher risk if you are African American.  Age. Risk increases with age.  Gender. Men are at higher risk than women before age 7 years. After age 11, women are at higher risk than men. Risk factors you can control include:  Not getting enough exercise or physical activity.  Being overweight.  Getting too much fat, sugar, calories, or salt in your diet.  Drinking too much alcohol. SIGNS AND SYMPTOMS Hypertension does not usually cause signs or symptoms. Extremely high blood pressure (hypertensive crisis) may cause headache, anxiety, shortness of breath, and nosebleed. DIAGNOSIS  To check if you have hypertension, your health care provider will measure your blood pressure while you are seated, with your arm held at the level of your heart. It should be measured at least twice  using the same arm. Certain conditions can cause a difference in blood pressure between your right and left arms. A blood pressure reading that is higher than normal on one occasion does not mean that you need treatment. If one blood pressure reading is high, ask your health care provider about having it checked again. TREATMENT  Treating high blood pressure includes making lifestyle changes and possibly taking medicine. Living a healthy lifestyle can help lower high blood pressure. You may need to change some of your habits. Lifestyle changes may include:  Following the DASH diet. This diet is high in fruits, vegetables, and whole grains. It is low in salt, red meat, and added sugars.  Getting at least 2 hours of brisk physical activity every week.  Losing weight if necessary.  Not smoking.  Limiting alcoholic beverages.  Learning ways to reduce stress. If lifestyle changes are not enough to get your blood pressure under control, your health care provider may prescribe medicine. You may need to take more than one. Work closely with your health care provider to understand the risks and benefits. HOME CARE INSTRUCTIONS  Have your blood pressure rechecked as directed by your health care provider.   Take medicines only as directed by your health care provider. Follow the directions carefully. Blood pressure medicines must be taken as prescribed. The medicine does not work as well when you skip doses. Skipping doses also puts you at risk for problems.   Do not smoke.   Monitor your blood pressure at home as directed by your health care provider. SEEK MEDICAL CARE IF:   You think you are having a reaction  to medicines taken.  You have recurrent headaches or feel dizzy.  You have swelling in your ankles.  You have trouble with your vision. SEEK IMMEDIATE MEDICAL CARE IF:  You develop a severe headache or confusion.  You have unusual weakness, numbness, or feel faint.  You  have severe chest or abdominal pain.  You vomit repeatedly.  You have trouble breathing. MAKE SURE YOU:   Understand these instructions.  Will watch your condition.  Will get help right away if you are not doing well or get worse. Document Released: 07/14/2005 Document Revised: 11/28/2013 Document Reviewed: 05/06/2013 [facility] Patient Information 2015 [place], [place]. This information is not intended to replace advice given to you by your health care provider. Make sure you discuss any questions you have with your health care provider.  PRIMARY CARE Paramedic at [place], [place] [phone]  Fax [phone]  Therapist, music at [facility] [address] [place], [place] [phone]  Fax [phone]  Therapist, music at [place] / [place] [phone] [address], [place] [phone]  Fax [phone]  [facility] [place] at [facility] [address] [place]  [place], [place] [phone]  Fax [phone]  [place] [address] [place] [address] [place], [place] [phone]  Fax [phone]  [facility] at [facility] [place], [place] [phone]  Fax [phone]   [place] @ [place] [address] Phone: [phone]   [place] @ [facility] [place] [place] [address] Phone: [place] @ [place] [address] [place] [address] Phone: [place] @ [place] [place] [address] Phone: [place] @ [place] [employer], [place] [address] Phone: [phone]   [place] @ [place] [address] [place] [address] Phone: [phone]   Dr. Rachell Cipro [address] [place] [address] [phone]

## 2014-05-15 NOTE — ED Provider Notes (Signed)
Charles Ruiz is a 48 y.o. male who presents to Urgent Care today for elevated blood pressure. Patient was found to have elevated blood pressure at an insurance screening today.  He is asymptomatic. He feels well no chest pain palpitations or shortness of breath. He is not taking any medications. He does not have a primary care Dr.    Past Medical History  Diagnosis Date  . Hypertension    History  Substance Use Topics  . Smoking status: Current Every Day Smoker -- 1.50 packs/day  . Smokeless tobacco: Never Used  . Alcohol Use: Yes     Comment: beer occasionally   ROS as above Medications: No current facility-administered medications for this encounter.   Current Outpatient Prescriptions  Medication Sig Dispense Refill  . lisinopril-hydrochlorothiazide (PRINZIDE) 10-12.5 MG per tablet Take 1 tablet by mouth daily.  30 tablet  0    Exam:  BP 175/123  Pulse 84  Temp(Src) 98 F (36.7 C) (Oral)  Resp 16  Ht 5\' 11"  (1.803 m)  Wt 161 lb (73.029 kg)  BMI 22.46 kg/m2  SpO2 97% Gen: Well NAD HEENT: EOMI,  MMM Lungs: Normal work of breathing. CTABL Heart: RRR no MRG Abd: NABS, Soft. Nondistended, Nontender Exts: Brisk capillary refill, warm and well perfused.   Results for orders placed during the hospital encounter of 05/15/14 (from the past 24 hour(s))  POCT I-STAT, CHEM 8     Status: Abnormal   Collection Time    05/15/14 12:36 PM      Result Value Ref Range   Sodium 141  137 - 147 mEq/L   Potassium 4.1  3.7 - 5.3 mEq/L   Chloride 103  96 - 112 mEq/L   BUN 14  6 - 23 mg/dL   Creatinine, Ser 1.20  0.50 - 1.35 mg/dL   Glucose, Bld 101 (*) 70 - 99 mg/dL   Calcium, Ion 1.23  1.12 - 1.23 mmol/L   TCO2 26  0 - 100 mmol/L   Hemoglobin 17.0  13.0 - 17.0 g/dL   HCT 50.0  39.0 - 52.0 %   No results found.  Assessment and Plan: 48 y.o. male with hypertension. Start hydrochlorothiazide/lisinopril. Followup with PCP.   Discussed warning signs or symptoms. Please see  discharge instructions. Patient expresses understanding.     Gregor Hams, MD 05/15/14 [phone]

## 2014-05-15 NOTE — ED Notes (Signed)
Pt is in today because his blood pressure was elevated at an insurance screening that was done at work today, pt is sitting upright and denies any discomfort

## 2014-09-15 ENCOUNTER — Other Ambulatory Visit: Payer: Self-pay | Admitting: Family Medicine

## 2014-09-15 DIAGNOSIS — E041 Nontoxic single thyroid nodule: Secondary | ICD-10-CM

## 2014-09-20 ENCOUNTER — Other Ambulatory Visit: Payer: BLUE CROSS/BLUE SHIELD

## 2016-03-16 ENCOUNTER — Encounter (HOSPITAL_COMMUNITY): Payer: Self-pay | Admitting: *Deleted

## 2016-03-16 ENCOUNTER — Ambulatory Visit (HOSPITAL_COMMUNITY)
Admission: EM | Admit: 2016-03-16 | Discharge: 2016-03-16 | Disposition: A | Discharge status: Home / Self Care | Payer: BLUE CROSS/BLUE SHIELD | Attending: Family Medicine | Admitting: Family Medicine

## 2016-03-16 DIAGNOSIS — T7840XA Allergy, unspecified, initial encounter: Secondary | ICD-10-CM

## 2016-03-16 DIAGNOSIS — R21 Rash and other nonspecific skin eruption: Secondary | ICD-10-CM

## 2016-03-16 MED ORDER — CLOTRIMAZOLE-BETAMETHASONE 1-0.05 % EX CREA: TOPICAL_CREAM | CUTANEOUS | 0 refills | Status: DC

## 2016-03-16 NOTE — Discharge Instructions (Signed)
If rash returns after using the prescribed cream, please discuss with your employer.

## 2016-03-16 NOTE — ED Provider Notes (Signed)
[facility]    CSN: [id number] Arrival date & time: 03/16/16  1203  First Provider Contact:  First MD Initiated Contact with Patient 03/16/16 1257        History   Chief Complaint Chief Complaint  Patient presents with  . Rash    HPI Timofey Essary is a 50 y.o. male.   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 He's developed a rash over his for head and along his neck over the past month. The rash onset coincides with having to wear protective head where that extends to his neck to prevent contamination of food that he started wearing 3 months ago.  Patient describes the rash is itchy. He was given an antifungal cream by a pharmacist but hasn't worked. He has a rash and no other part of his body      Past Medical History:  Diagnosis Date  . Hypertension     Patient Active Problem List   Diagnosis Date Noted  . 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 Multiple thyroid nodules 03/17/2012    Past Surgical History:  Procedure Laterality Date  . finger amputation    . THYROID SURGERY         Home Medications    Prior to Admission medications   Medication Sig Start Date End Date Taking? Authorizing Provider  clotrimazole-betamethasone (LOTRISONE) cream Apply to affected area 2 times daily prn 03/16/16   Robyn Haber, MD  lisinopril-hydrochlorothiazide (PRINZIDE) 10-12.5 MG per tablet Take 1 tablet by mouth daily. 05/15/14   Gregor Hams, MD    Family History Family History  Problem Relation Age of Onset  . Hypertension Mother   . Hypertension Sister   . Hypertension Brother     Social History Social History  Substance Use Topics  . Smoking status: Current Every Day Smoker    Packs/day: 1.50  . Smokeless tobacco: Never Used  . Alcohol use Yes     Comment: beer occasionally     Allergies   Review of patient's allergies indicates no known allergies.   Review of Systems Review of Systems   Physical Exam Triage  Vital Signs ED Triage Vitals [03/16/16 1240]  Enc Vitals Group     BP 158/100     Pulse Rate 78     Resp 18     Temp 98.6 F (37 C)     Temp Source Oral     SpO2 100 %     Weight      Height      Head Circumference      Peak Flow      Pain Score      Pain Loc      Pain Edu?      Excl. in [place]?    No data found.   Updated Vital Signs BP 158/100 (BP Location: Right Arm)   Pulse 78   Temp 98.6 F (37 C) (Oral)   Resp 18   SpO2 100%   Visual Acuity Right Eye Distance:   Left Eye Distance:   Bilateral Distance:    Right Eye Near:   Left Eye Near:    Bilateral Near:     Physical Exam  Constitutional: He appears well-developed and well-nourished.  Skin: Skin is warm and dry.  Patient has a raised scaly rash over all sides of his for head as well as linear and slightly curved scaly rash on both sides of his neck extending down from  the posterior cervical region to the front near where the [facility] takes off.  The rash is excoriated in places but is not wet.  Nursing note and vitals reviewed.    UC Treatments / Results  Labs (all labs ordered are listed, but only abnormal results are displayed) Labs Reviewed - No data to display  EKG  EKG Interpretation None       Radiology No results found.  Procedures Procedures (including critical care time)  Medications Ordered in UC Medications - No data to display   Initial Impression / Assessment and Plan / UC Course  I have reviewed the triage vital signs and the nursing notes.  Pertinent labs & imaging results that were available during my care of the patient were reviewed by me and considered in my medical decision making (see chart for details).  Clinical Course     Final Clinical Impressions(s) / UC Diagnoses   Final diagnoses:  Allergic reaction, initial encounter  Rash    New Prescriptions New Prescriptions   CLOTRIMAZOLE-BETAMETHASONE (LOTRISONE) CREAM    Apply to affected area 2 times daily prn       Robyn Haber, MD 03/16/16 1307

## 2016-03-16 NOTE — ED Triage Notes (Signed)
Pt  Reports  A  Rash  On  His  Face  For  About  1  Month       Pt  States  It was  Somewhat  Worse  Last  Pm    He has  Been  Using    clot rimazole       For  The  Symptoms

## 2016-03-31 ENCOUNTER — Encounter (HOSPITAL_COMMUNITY): Payer: Self-pay | Admitting: Emergency Medicine

## 2016-03-31 ENCOUNTER — Ambulatory Visit (HOSPITAL_COMMUNITY)
Admission: EM | Admit: 2016-03-31 | Discharge: 2016-03-31 | Disposition: A | Discharge status: Home / Self Care | Payer: BLUE CROSS/BLUE SHIELD | Attending: Radiology | Admitting: Radiology

## 2016-03-31 DIAGNOSIS — R21 Rash and other nonspecific skin eruption: Secondary | ICD-10-CM | POA: Diagnosis not present

## 2016-03-31 MED ORDER — MOMETASONE FUROATE 0.1 % EX OINT: TOPICAL_OINTMENT | Freq: Every day | CUTANEOUS | 0 refills | Status: DC

## 2016-03-31 MED ORDER — PREDNISONE 10 MG PO TABS: 10.0000 mg | ORAL_TABLET | Freq: Every day | ORAL | 0 refills | Status: DC

## 2016-03-31 NOTE — ED Triage Notes (Signed)
The patient presented to the [facility] with a complaint of a rash on his face for over 1 month. The patient was treated here on 03/16/2016 for the rash and he stated that it has not gotten any better.

## 2016-03-31 NOTE — ED Provider Notes (Signed)
CSN: [id number]     Arrival date & time 03/31/16  1422 History   None    Chief Complaint  Patient presents with  . 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 He's developed a rash over his for head and along his neck over the past month. The rash onset coincides with having to wear protective head where that extends to his neck to prevent contamination of food that he started wearing 3 months ago.  Patient describes the rash is itchy. He was given an antifungal cream by a pharmacist but hasn't worked. He has a rash and no other part of his body. Patient was seen at this facility and prescribed lotrisone which he reports no relief.       Past Medical History:  Diagnosis Date  . Hypertension    Past Surgical History:  Procedure Laterality Date  . finger amputation    . THYROID SURGERY     Family History  Problem Relation Age of Onset  . Hypertension Mother   . Hypertension Sister   . Hypertension Brother    Social History  Substance Use Topics  . Smoking status: Current Every Day Smoker    Packs/day: 1.50  . Smokeless tobacco: Never Used  . Alcohol use Yes     Comment: beer occasionally    Review of Systems  Constitutional: Negative.   Skin: Positive for rash.       With pruritis around to bilateral     Allergies  Review of patient's allergies indicates no known allergies.  Home Medications   Prior to Admission medications   Medication Sig Start Date End Date Taking? Authorizing Provider  clotrimazole-betamethasone (LOTRISONE) cream Apply to affected area 2 times daily prn 03/16/16  Yes Robyn Haber, MD  lisinopril-hydrochlorothiazide (PRINZIDE) 10-12.5 MG per tablet Take 1 tablet by mouth daily. 05/15/14  Yes Gregor Hams, MD  mometasone (ELOCON) 0.1 % ointment Apply topically daily. 03/31/16   Jacqualine Mau, NP  predniSONE (DELTASONE) 10 MG tablet Take 1  tablet (10 mg total) by mouth daily with breakfast. 03/31/16   Jacqualine Mau, NP   Meds Ordered and Administered this Visit  Medications - No data to display  BP (!) 151/103 (BP Location: Right Arm)   Pulse 94   Temp 98.1 F (36.7 C) (Oral)   Resp 16   SpO2 98%  No data found.   Physical Exam  Constitutional: He is oriented to person, place, and time. He appears well-developed and well-nourished.  Cardiovascular: Regular rhythm.   Pulmonary/Chest: Effort normal and breath sounds normal.  Neurological: He is alert and oriented to person, place, and time.  Skin: Skin is warm. Rash ( edematous linear and curved rash beneath both eyes and lateral toward ears. ) noted.    Urgent Care Course   Clinical Course    Procedures (including critical care time)  Labs Review Labs Reviewed - No data to display  Imaging Review No results found.   Visual Acuity Review  Right Eye Distance:   Left Eye Distance:   Bilateral Distance:    Right Eye Near:   Left Eye Near:    Bilateral Near:         MDM   1. Rash        Jacqualine Mau, NP 03/31/16 1619    Jacqualine Mau, NP 03/31/16 1925

## 2016-04-18 DIAGNOSIS — R21 Rash and other nonspecific skin eruption: Secondary | ICD-10-CM | POA: Diagnosis not present

## 2016-04-18 DIAGNOSIS — Z23 Encounter for immunization: Secondary | ICD-10-CM | POA: Diagnosis not present

## 2016-05-14 DIAGNOSIS — I10 Essential (primary) hypertension: Secondary | ICD-10-CM | POA: Diagnosis not present

## 2016-05-15 DIAGNOSIS — B355 Tinea imbricata: Secondary | ICD-10-CM | POA: Diagnosis not present

## 2016-06-25 DIAGNOSIS — I10 Essential (primary) hypertension: Secondary | ICD-10-CM | POA: Diagnosis not present

## 2016-06-25 DIAGNOSIS — Z0000 Encounter for general adult medical examination without abnormal findings: Secondary | ICD-10-CM | POA: Diagnosis not present

## 2016-08-12 DIAGNOSIS — I10 Essential (primary) hypertension: Secondary | ICD-10-CM | POA: Diagnosis not present

## 2016-08-12 DIAGNOSIS — M25512 Pain in left shoulder: Secondary | ICD-10-CM | POA: Diagnosis not present

## 2016-08-12 DIAGNOSIS — M25511 Pain in right shoulder: Secondary | ICD-10-CM | POA: Diagnosis not present

## 2016-08-12 DIAGNOSIS — E785 Hyperlipidemia, unspecified: Secondary | ICD-10-CM | POA: Diagnosis not present

## 2016-08-18 ENCOUNTER — Other Ambulatory Visit: Payer: Self-pay | Admitting: Physician Assistant

## 2016-08-18 ENCOUNTER — Ambulatory Visit
Admission: RE | Admit: 2016-08-18 | Discharge: 2016-08-18 | Disposition: A | Discharge status: Home / Self Care | Payer: BLUE CROSS/BLUE SHIELD | Source: Ambulatory Visit | Attending: Physician Assistant | Admitting: Physician Assistant

## 2016-08-18 DIAGNOSIS — M19011 Primary osteoarthritis, right shoulder: Secondary | ICD-10-CM | POA: Diagnosis not present

## 2016-08-18 DIAGNOSIS — M19012 Primary osteoarthritis, left shoulder: Secondary | ICD-10-CM | POA: Diagnosis not present

## 2016-08-18 DIAGNOSIS — M25511 Pain in right shoulder: Secondary | ICD-10-CM

## 2016-08-18 DIAGNOSIS — M25512 Pain in left shoulder: Secondary | ICD-10-CM

## 2016-12-11 ENCOUNTER — Ambulatory Visit (HOSPITAL_COMMUNITY)
Admission: EM | Admit: 2016-12-11 | Discharge: 2016-12-11 | Disposition: A | Discharge status: Home / Self Care | Payer: BLUE CROSS/BLUE SHIELD | Attending: Internal Medicine | Admitting: Internal Medicine

## 2016-12-11 ENCOUNTER — Encounter (HOSPITAL_COMMUNITY): Payer: Self-pay | Admitting: Emergency Medicine

## 2016-12-11 DIAGNOSIS — J9801 Acute bronchospasm: Secondary | ICD-10-CM | POA: Diagnosis not present

## 2016-12-11 DIAGNOSIS — R062 Wheezing: Secondary | ICD-10-CM | POA: Diagnosis not present

## 2016-12-11 DIAGNOSIS — R0602 Shortness of breath: Secondary | ICD-10-CM | POA: Diagnosis not present

## 2016-12-11 MED ORDER — ALBUTEROL SULFATE HFA 108 (90 BASE) MCG/ACT IN AERS: 2.0000 | INHALATION_SPRAY | RESPIRATORY_TRACT | 0 refills | Status: AC | PRN

## 2016-12-11 MED ORDER — IPRATROPIUM-ALBUTEROL 0.5-2.5 (3) MG/3ML IN SOLN
3.0000 mL | Freq: Once | RESPIRATORY_TRACT | Status: AC
  Administered 2016-12-11: 3 mL via RESPIRATORY_TRACT

## 2016-12-11 MED ORDER — PREDNISONE 50 MG PO TABS: 50.0000 mg | ORAL_TABLET | Freq: Every day | ORAL | 0 refills | Status: DC

## 2016-12-11 MED ORDER — IPRATROPIUM-ALBUTEROL 0.5-2.5 (3) MG/3ML IN SOLN
RESPIRATORY_TRACT | Status: AC
  Filled 2016-12-11: qty 3

## 2016-12-11 NOTE — Discharge Instructions (Addendum)
Duoneb breathing treatment, which has albuterol in it, was given at the urgent care today. Anticipate gradual improvement in the wheezing/chest tightness over the next several days.  Prescriptions for prednisone (steroid, for airway irritation) and albuterol inhaler (for wheezing) were sent to the [place] at [facility].  Recheck for new fever >100.5, increasing phlegm production/nasal discharge, or if wheezing/chest tightness is no starting to improve in a few days.

## 2016-12-11 NOTE — ED Triage Notes (Addendum)
Complains of sob that started 2 weeks ago and is intermittent.  Doe.  Denies pain, denies asthma.  Denies history of this complaint.  Radial pulse is regular.  Breathing is worse with lying down, breathes easier with sitting up.  Patient does say may feel like a chest cold.  Patient does admit to ches tightness.  Patient has noticed wheezing, no edema

## 2016-12-14 NOTE — ED Provider Notes (Signed)
[facility]    CSN: [id number] Arrival date & time: 12/11/16  1459     History   Chief Complaint Chief Complaint  Patient presents with  . Shortness of Breath    HPI Charles Ruiz is a 51 y.o. male. Chest tightness, wheezing, maybe a little cough.  No fever.  No runny/congested nose.  No leg swelling, no chest pain.    HPI  Past Medical History:  Diagnosis Date  . Hypertension     Patient Active Problem List   Diagnosis Date Noted  . 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 Multiple thyroid nodules 03/17/2012    Past Surgical History:  Procedure Laterality Date  . finger amputation    . THYROID SURGERY         Home Medications    Prior to Admission medications   Medication Sig Start Date End Date Taking? Authorizing Provider  OVER THE COUNTER MEDICATION prescription medicine that is for arthritis inflammation   Yes [provider]  albuterol (PROVENTIL HFA;VENTOLIN HFA) 108 (90 Base) MCG/ACT inhaler Inhale 2 puffs into the lungs every 4 (four) hours as needed for wheezing or shortness of breath. 12/11/16   Sherlene Shams, MD  lisinopril-hydrochlorothiazide (PRINZIDE) 10-12.5 MG per tablet Take 1 tablet by mouth daily. 05/15/14   Gregor Hams, MD  mometasone (ELOCON) 0.1 % ointment Apply topically daily. 03/31/16   Jacqualine Mau, NP  predniSONE (DELTASONE) 50 MG tablet Take 1 tablet (50 mg total) by mouth daily. 12/11/16   Sherlene Shams, MD    Family History Family History  Problem Relation Age of Onset  . Hypertension Mother   . Hypertension Sister   . Hypertension Brother     Social History Social History  Substance Use Topics  . Smoking status: Former Smoker    Packs/day: 1.50  . Smokeless tobacco: Never Used  . Alcohol use Yes     Comment: beer occasionally     Allergies   Patient has no known allergies.   Review of Systems Review of Systems  All other systems reviewed and are negative.    Physical  Exam Triage Vital Signs ED Triage Vitals  Enc Vitals Group     BP 12/11/16 1547 (!) 126/91     Pulse Rate 12/11/16 1547 85     Resp 12/11/16 1547 16     Temp 12/11/16 1547 98.5 F (36.9 C)     Temp Source 12/11/16 1547 Oral     SpO2 12/11/16 1547 98 %     Weight --      Height --      Pain Score 12/11/16 1733 1     Pain Loc --    Updated Vital Signs BP (!) 126/91 (BP Location: Right Arm) Comment: notified rn  Pulse 85   Temp 98.5 F (36.9 C) (Oral)   Resp 16   SpO2 98%   Physical Exam  Constitutional: He is oriented to person, place, and time. No distress.  Alert, nicely groomed  HENT:  Head: Atraumatic.  Eyes:  Conjugate gaze, no eye redness/drainage  Neck: Neck supple.  Cardiovascular: Normal rate and regular rhythm.   Pulmonary/Chest: No respiratory distress. He has no rales.  Coarse breath sounds with wheezing, symmetric throughout.  Maybe splinting slightly  Abdominal: He exhibits no distension.  Musculoskeletal: Normal range of motion. He exhibits no edema.  Neurological: He is alert and oriented to person, place, and time.  Skin: Skin is warm and dry.  No cyanosis  Nursing note and vitals reviewed.    UC Treatments / Results   Procedures Procedures (including critical care time)  Medications Ordered in UC Medications  ipratropium-albuterol (DUONEB) 0.5-2.5 (3) MG/3ML nebulizer solution 3 mL (3 mLs Nebulization Given 12/11/16 1657)   Feels a lot better, breathing easier after breathing treatment  Final Clinical Impressions(s) / UC Diagnoses   Final diagnoses:  Bronchospasm   Duoneb breathing treatment, which has albuterol in it, was given at the urgent care today. Anticipate gradual improvement in the wheezing/chest tightness over the next several days.  Prescriptions for prednisone (steroid, for airway irritation) and albuterol inhaler (for wheezing) were sent to the [place] at [facility].  Recheck for new fever >100.5, increasing phlegm  production/nasal discharge, or if wheezing/chest tightness is no starting to improve in a few days.    New Prescriptions Discharge Medication List as of 12/11/2016  5:26 PM    START taking these medications   Details  albuterol (PROVENTIL HFA;VENTOLIN HFA) 108 (90 Base) MCG/ACT inhaler Inhale 2 puffs into the lungs every 4 (four) hours as needed for wheezing or shortness of breath., Starting Thu 12/11/2016, Normal         Sherlene Shams, MD 12/14/16 1304

## 2017-01-21 DIAGNOSIS — E785 Hyperlipidemia, unspecified: Secondary | ICD-10-CM | POA: Diagnosis not present

## 2017-01-21 DIAGNOSIS — I10 Essential (primary) hypertension: Secondary | ICD-10-CM | POA: Diagnosis not present

## 2017-06-05 DIAGNOSIS — J4521 Mild intermittent asthma with (acute) exacerbation: Secondary | ICD-10-CM | POA: Diagnosis not present

## 2017-06-05 DIAGNOSIS — Z23 Encounter for immunization: Secondary | ICD-10-CM | POA: Diagnosis not present

## 2017-07-03 ENCOUNTER — Emergency Department (HOSPITAL_COMMUNITY): Payer: BLUE CROSS/BLUE SHIELD

## 2017-07-03 ENCOUNTER — Encounter (HOSPITAL_COMMUNITY): Payer: Self-pay

## 2017-07-03 DIAGNOSIS — J9801 Acute bronchospasm: Secondary | ICD-10-CM | POA: Insufficient documentation

## 2017-07-03 DIAGNOSIS — I10 Essential (primary) hypertension: Secondary | ICD-10-CM | POA: Diagnosis not present

## 2017-07-03 DIAGNOSIS — Z79899 Other long term (current) drug therapy: Secondary | ICD-10-CM | POA: Diagnosis not present

## 2017-07-03 DIAGNOSIS — R0602 Shortness of breath: Secondary | ICD-10-CM | POA: Diagnosis not present

## 2017-07-03 DIAGNOSIS — Z87891 Personal history of nicotine dependence: Secondary | ICD-10-CM | POA: Diagnosis not present

## 2017-07-03 MED ORDER — ALBUTEROL SULFATE (2.5 MG/3ML) 0.083% IN NEBU
5.0000 mg | INHALATION_SOLUTION | Freq: Once | RESPIRATORY_TRACT | Status: AC
  Administered 2017-07-03: 5 mg via RESPIRATORY_TRACT
  Filled 2017-07-03: qty 6

## 2017-07-03 NOTE — ED Triage Notes (Signed)
Pt states he has asthma , feeling SOB for the past couple of months. Expiratory wheezing noted, unrelieved by inhaler

## 2017-07-04 ENCOUNTER — Emergency Department (HOSPITAL_COMMUNITY)
Admission: EM | Admit: 2017-07-04 | Discharge: 2017-07-04 | Disposition: A | Discharge status: Home / Self Care | Payer: BLUE CROSS/BLUE SHIELD | Attending: Emergency Medicine | Admitting: Emergency Medicine

## 2017-07-04 DIAGNOSIS — J9801 Acute bronchospasm: Secondary | ICD-10-CM

## 2017-07-04 HISTORY — DX: Unspecified asthma, uncomplicated: J45.909

## 2017-07-04 MED ORDER — ALBUTEROL SULFATE (2.5 MG/3ML) 0.083% IN NEBU
5.0000 mg | INHALATION_SOLUTION | Freq: Once | RESPIRATORY_TRACT | Status: AC
  Administered 2017-07-04: 5 mg via RESPIRATORY_TRACT
  Filled 2017-07-04: qty 6

## 2017-07-04 MED ORDER — ALBUTEROL SULFATE HFA 108 (90 BASE) MCG/ACT IN AERS: 2.0000 | INHALATION_SPRAY | RESPIRATORY_TRACT | 0 refills | Status: DC | PRN

## 2017-07-04 MED ORDER — IPRATROPIUM BROMIDE 0.02 % IN SOLN
0.5000 mg | Freq: Once | RESPIRATORY_TRACT | Status: AC
  Administered 2017-07-04: 0.5 mg via RESPIRATORY_TRACT
  Filled 2017-07-04: qty 2.5

## 2017-07-04 MED ORDER — PREDNISONE 20 MG PO TABS: 60.0000 mg | ORAL_TABLET | Freq: Every day | ORAL | 0 refills | Status: DC

## 2017-07-04 MED ORDER — PREDNISONE 20 MG PO TABS
60.0000 mg | ORAL_TABLET | Freq: Once | ORAL | Status: AC
  Administered 2017-07-04: 60 mg via ORAL
  Filled 2017-07-04: qty 3

## 2017-07-04 NOTE — ED Notes (Signed)
Pt departed in NAD, refused use of wheelchair.  

## 2017-07-04 NOTE — Discharge Instructions (Signed)
Continue your albuterol 2-4 puffs every 2-4 hours as needed for shortness of breath and wheezing.

## 2017-07-04 NOTE — ED Provider Notes (Signed)
TIME SEEN: 4:15 AM  CHIEF COMPLAINT: Shortness of breath, wheezing  HPI: Patient is a 51 year old male with history of hypertension and recent diagnosis of possible asthma several months ago who is scheduled for outpatient pulmonary tests who presents to the emergency department with several months of intermittent wheezing and shortness of breath.  No chest pain or chest discomfort.  No fever.  Has had intermittent dry cough.  No lower extremity swelling or pain.  Was given a breathing treatment in triage which he states improved his symptoms but reports now he is having some return of symptoms.  He has an albuterol inhaler.  No nebulizer.  He states the last time he was on steroids was a month ago.   ROS: See HPI Constitutional: no fever  Eyes: no drainage  ENT: no runny nose   Cardiovascular:  no chest pain  Resp:  SOB  GI: no vomiting GU: no dysuria Integumentary: no rash  Allergy: no hives  Musculoskeletal: no leg swelling  Neurological: no slurred speech ROS otherwise negative  PAST MEDICAL HISTORY/PAST SURGICAL HISTORY:  Past Medical History:  Diagnosis Date  . Asthma   . Hypertension     MEDICATIONS:  Prior to Admission medications   Medication Sig Start Date End Date Taking? Authorizing Provider  albuterol (PROVENTIL HFA;VENTOLIN HFA) 108 (90 Base) MCG/ACT inhaler Inhale 2 puffs into the lungs every 4 (four) hours as needed for wheezing or shortness of breath. 12/11/16   Sherlene Shams, MD  lisinopril-hydrochlorothiazide (PRINZIDE) 10-12.5 MG per tablet Take 1 tablet by mouth daily. 05/15/14   Gregor Hams, MD  mometasone (ELOCON) 0.1 % ointment Apply topically daily. 03/31/16   Jacqualine Mau, NP  OVER THE COUNTER MEDICATION prescription medicine that is for arthritis inflammation    [provider]  predniSONE (DELTASONE) 50 MG tablet Take 1 tablet (50 mg total) by mouth daily. 12/11/16   Sherlene Shams, MD    ALLERGIES:  No Known Allergies  SOCIAL  HISTORY:  Social History   Tobacco Use  . Smoking status: Former Smoker    Packs/day: 1.50  . Smokeless tobacco: Never Used  Substance Use Topics  . Alcohol use: Yes    Comment: beer occasionally    FAMILY HISTORY: Family History  Problem Relation Age of Onset  . Hypertension Mother   . Hypertension Sister   . Hypertension Brother     EXAM: BP 121/85 (BP Location: Left Arm)   Pulse 80   Temp (!) 97.5 F (36.4 C)   Resp 12   SpO2 99%  CONSTITUTIONAL: Alert and oriented and responds appropriately to questions. Well-appearing; well-nourished HEAD: Normocephalic EYES: Conjunctivae clear, pupils appear equal, EOMI ENT: normal nose; moist mucous membranes NECK: Supple, no meningismus, no nuchal rigidity, no LAD  CARD: RRR; S1 and S2 appreciated; no murmurs, no clicks, no rubs, no gallops RESP: Normal chest excursion without splinting or tachypnea; breath sounds equal bilaterally.  Scattered expiratory wheezes, no rhonchi, no rales, no hypoxia or respiratory distress, speaking full sentences ABD/GI: Normal bowel sounds; non-distended; soft, non-tender, no rebound, no guarding, no peritoneal signs, no hepatosplenomegaly BACK:  The back appears normal and is non-tender to palpation, there is no CVA tenderness EXT: Normal ROM in all joints; non-tender to palpation; no edema; normal capillary refill; no cyanosis, no calf tenderness or swelling    SKIN: Normal color for age and race; warm; no rash NEURO: Moves all extremities equally PSYCH: The patient's mood and manner are appropriate. Grooming and  personal hygiene are appropriate.  MEDICAL DECISION MAKING: Patient here with bronchospasm.  Chest x-ray is clear.  Will give another breathing treatment and reassess.  I doubt PE or ACS.  Doubt dissection.  EKG shows no ischemic abnormality.  I do not feel he needs antibiotics.  No sign of volume overload.  ED PROGRESS: Patient's lungs are now clear.  He states he is feeling much better.   No hypoxia or increased work of breathing.  I feel he is safe to be discharged home.  Will discharge with prescription of albuterol inhaler and steroids.  He has PCP follow-up and states he is scheduled for outpatient pulmonary function test.  He states he quit smoking 2 years ago.   At this time, I do not feel there is any life-threatening condition present. I have reviewed and discussed all results (EKG, imaging, lab, urine as appropriate) and exam findings with patient/family. I have reviewed nursing notes and appropriate previous records.  I feel the patient is safe to be discharged home without further emergent workup and can continue workup as an outpatient as needed. Discussed usual and customary return precautions. Patient/family verbalize understanding and are comfortable with this plan.  Outpatient follow-up has been provided if needed. All questions have been answered.      EKG Interpretation  Date/Time:  Friday July 03 2017 22:47:01 EST Ventricular Rate:  87 PR Interval:  168 QRS Duration: 84 QT Interval:  344 QTC Calculation: 413 R Axis:   89 Text Interpretation:  Normal sinus rhythm Septal infarct , age undetermined Abnormal ECG Nonspecific T wave abnormality When compared with ECG of 10/11/2016, No significant change was found Confirmed by Delora Fuel ([id number]) on 07/03/2017 10:51:07 PM         Myeasha Ballowe, Delice Bison, DO 07/04/17 [phone]

## 2017-07-23 DIAGNOSIS — R0602 Shortness of breath: Secondary | ICD-10-CM | POA: Diagnosis not present

## 2017-07-23 DIAGNOSIS — R9389 Abnormal findings on diagnostic imaging of other specified body structures: Secondary | ICD-10-CM | POA: Diagnosis not present

## 2017-07-23 DIAGNOSIS — I10 Essential (primary) hypertension: Secondary | ICD-10-CM | POA: Diagnosis not present

## 2017-07-23 DIAGNOSIS — R062 Wheezing: Secondary | ICD-10-CM | POA: Diagnosis not present

## 2017-07-29 ENCOUNTER — Other Ambulatory Visit: Payer: Self-pay | Admitting: Family Medicine

## 2017-07-29 ENCOUNTER — Ambulatory Visit
Admission: RE | Admit: 2017-07-29 | Discharge: 2017-07-29 | Disposition: A | Discharge status: Home / Self Care | Payer: BLUE CROSS/BLUE SHIELD | Source: Ambulatory Visit | Attending: Family Medicine | Admitting: Family Medicine

## 2017-07-29 DIAGNOSIS — J45909 Unspecified asthma, uncomplicated: Secondary | ICD-10-CM | POA: Diagnosis not present

## 2017-07-29 DIAGNOSIS — R9389 Abnormal findings on diagnostic imaging of other specified body structures: Secondary | ICD-10-CM

## 2017-07-29 DIAGNOSIS — R911 Solitary pulmonary nodule: Secondary | ICD-10-CM | POA: Diagnosis not present

## 2017-08-06 DIAGNOSIS — R0602 Shortness of breath: Secondary | ICD-10-CM | POA: Diagnosis not present

## 2017-08-07 DIAGNOSIS — J4530 Mild persistent asthma, uncomplicated: Secondary | ICD-10-CM | POA: Diagnosis not present

## 2017-08-07 DIAGNOSIS — I10 Essential (primary) hypertension: Secondary | ICD-10-CM | POA: Diagnosis not present

## 2017-09-25 ENCOUNTER — Encounter (HOSPITAL_COMMUNITY): Payer: Self-pay | Admitting: Emergency Medicine

## 2017-09-25 ENCOUNTER — Emergency Department (HOSPITAL_COMMUNITY)
Admission: EM | Admit: 2017-09-25 | Discharge: 2017-09-25 | Disposition: A | Discharge status: Home / Self Care | Payer: BLUE CROSS/BLUE SHIELD | Attending: Emergency Medicine | Admitting: Emergency Medicine

## 2017-09-25 DIAGNOSIS — Z87891 Personal history of nicotine dependence: Secondary | ICD-10-CM | POA: Diagnosis not present

## 2017-09-25 DIAGNOSIS — R51 Headache: Secondary | ICD-10-CM | POA: Diagnosis not present

## 2017-09-25 DIAGNOSIS — I10 Essential (primary) hypertension: Secondary | ICD-10-CM | POA: Diagnosis not present

## 2017-09-25 DIAGNOSIS — R569 Unspecified convulsions: Secondary | ICD-10-CM

## 2017-09-25 DIAGNOSIS — J45909 Unspecified asthma, uncomplicated: Secondary | ICD-10-CM | POA: Insufficient documentation

## 2017-09-25 DIAGNOSIS — Z79899 Other long term (current) drug therapy: Secondary | ICD-10-CM | POA: Diagnosis not present

## 2017-09-25 HISTORY — DX: Pure hypercholesterolemia, unspecified: E78.00

## 2017-09-25 HISTORY — DX: Unspecified convulsions: R56.9

## 2017-09-25 LAB — URINALYSIS, ROUTINE W REFLEX MICROSCOPIC
BILIRUBIN URINE: NEGATIVE
Glucose, UA: NEGATIVE mg/dL
Hgb urine dipstick: NEGATIVE
Ketones, ur: NEGATIVE mg/dL
LEUKOCYTES UA: NEGATIVE
NITRITE: NEGATIVE
PH: 6 (ref 5.0–8.0)
Protein, ur: NEGATIVE mg/dL
SPECIFIC GRAVITY, URINE: 1.009 (ref 1.005–1.030)

## 2017-09-25 LAB — BASIC METABOLIC PANEL
ANION GAP: 14 (ref 5–15)
BUN: 19 mg/dL (ref 6–20)
CALCIUM: 9 mg/dL (ref 8.9–10.3)
CO2: 24 mmol/L (ref 22–32)
CREATININE: 1.3 mg/dL — AB (ref 0.61–1.24)
Chloride: 101 mmol/L (ref 101–111)
GLUCOSE: 110 mg/dL — AB (ref 65–99)
Potassium: 3.4 mmol/L — ABNORMAL LOW (ref 3.5–5.1)
Sodium: 139 mmol/L (ref 135–145)

## 2017-09-25 LAB — RAPID URINE DRUG SCREEN, HOSP PERFORMED
AMPHETAMINES: NOT DETECTED
Barbiturates: NOT DETECTED
Benzodiazepines: NOT DETECTED
Cocaine: NOT DETECTED
OPIATES: NOT DETECTED
Tetrahydrocannabinol: NOT DETECTED

## 2017-09-25 LAB — CBC
HCT: 39.6 % (ref 39.0–52.0)
HEMOGLOBIN: 13.2 g/dL (ref 13.0–17.0)
MCH: 27.7 pg (ref 26.0–34.0)
MCHC: 33.3 g/dL (ref 30.0–36.0)
MCV: 83.2 fL (ref 78.0–100.0)
PLATELETS: 196 10*3/uL (ref 150–400)
RBC: 4.76 MIL/uL (ref 4.22–5.81)
RDW: 13.5 % (ref 11.5–15.5)
WBC: 5 10*3/uL (ref 4.0–10.5)

## 2017-09-25 LAB — ETHANOL: ALCOHOL ETHYL (B): 158 mg/dL — AB (ref <10)

## 2017-09-25 NOTE — ED Triage Notes (Signed)
BIB EMS from home after witnessed seizure. Pt went upstairs to do a breathing tx, called out for his wife, she found him on floor "shaking all over" Upon EMS arrival pt post ictal, initially disoriented X4, pt now able to answer most questions correctly. States hx of seizures but has not had one in 8 yrs, does not take medicine for it.

## 2017-09-25 NOTE — ED Provider Notes (Signed)
[facility] EMERGENCY DEPARTMENT Provider Note   CSN: [id number] Arrival date & time: 09/25/17  0146     History   Chief Complaint Chief Complaint  Patient presents with  . Seizures    HPI Charles Ruiz is a 52 y.o. male.  The history is provided by the patient and medical records.  Seizures      52 y.o. M with hx of asthma, HTN, seizures, HLP, presenting to the ED after seizure at home.  Patient states he was at home and went upstairs to do a neb treatment as he has been having some issues with his asthma.  His wife was downstairs and heard a loud thud and went upstairs and found him in the floor shaking all over and he seemed rigid.  States this lasted for less than a minute.  She immediately called 911.  Upon their arrival patient was somewhat arousable but was very drowsy and not really able to answer questions.  He is become more awake and alert on the way here.  On my assessment he is awake, alert, appropriately oriented.  His only complaint is that he is sleepy and has a mild headache.  States he had a seizure in the past approximately 7 or 8 years ago that was felt to be due to an old head injury while playing football.  States he has not had any seizure since that time and has never taken seizure medications.  He has not had any fever or other recent illness.  Past Medical History:  Diagnosis Date  . Asthma   . High cholesterol   . Hypertension   . Seizures [facility])     Patient Active Problem List   Diagnosis Date Noted  . 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 Multiple thyroid nodules 03/17/2012    Past Surgical History:  Procedure Laterality Date  . finger amputation    . THYROID SURGERY         Home Medications    Prior to Admission medications   Medication Sig Start Date End Date Taking? Authorizing Provider  albuterol (PROVENTIL HFA;VENTOLIN HFA) 108 (90 Base) MCG/ACT inhaler Inhale 2 puffs into the lungs every 4 (four) hours as  needed for wheezing or shortness of breath. 12/11/16   Wynona Luna, MD  albuterol (PROVENTIL HFA;VENTOLIN HFA) 108 (90 Base) MCG/ACT inhaler Inhale 2 puffs into the lungs every 4 (four) hours as needed for wheezing or shortness of breath. 07/04/17   Ward, Delice Bison, DO  lisinopril-hydrochlorothiazide (PRINZIDE) 10-12.5 MG per tablet Take 1 tablet by mouth daily. 05/15/14   Gregor Hams, MD  mometasone (ELOCON) 0.1 % ointment Apply topically daily. 03/31/16   Jacqualine Mau, NP  OVER THE COUNTER MEDICATION prescription medicine that is for arthritis inflammation    [provider]  predniSONE (DELTASONE) 20 MG tablet Take 3 tablets (60 mg total) by mouth daily. 07/04/17   Ward, Delice Bison, DO    Family History Family History  Problem Relation Age of Onset  . Hypertension Mother   . Hypertension Sister   . Hypertension Brother     Social History Social History   Tobacco Use  . Smoking status: Former Smoker    Packs/day: 1.50  . Smokeless tobacco: Never Used  Substance Use Topics  . Alcohol use: Yes    Comment: beer occasionally  . Drug use: No     Allergies   Patient has no known allergies.   Review of Systems  Review of Systems  Neurological: Positive for seizures.  All other systems reviewed and are negative.    Physical Exam Updated Vital Signs BP 117/79   Pulse 80   Temp 98 F (36.7 C) (Oral)   Resp 15   Ht 5\' 11"  (1.803 m)   Wt 77.1 kg (170 lb)   SpO2 95%   BMI 23.71 kg/m   Physical Exam  Constitutional: He is oriented to person, place, and time. He appears well-developed and well-nourished. No distress.  HENT:  Head: Normocephalic and atraumatic.  Right Ear: External ear normal.  Left Ear: External ear normal.  Mouth/Throat: Oropharynx is clear and moist.  No tongue laceration, dentition intact, mid-face stable; no hemotympanum; no skull depression or hematoma  Eyes: Conjunctivae and EOM are normal. Pupils are equal, round, and  reactive to light.  Neck: Normal range of motion and full passive range of motion without pain. Neck supple. No neck rigidity.  No rigidity, no meningismus  Cardiovascular: Normal rate, regular rhythm and normal heart sounds.  No murmur heard. Pulmonary/Chest: Effort normal and breath sounds normal. No respiratory distress. He has no wheezes. He has no rhonchi.  Abdominal: Soft. Bowel sounds are normal. There is no tenderness. There is no guarding.  Musculoskeletal: Normal range of motion. He exhibits no edema.  Neurological: He is alert and oriented to person, place, and time. He has normal strength. He displays no tremor. No cranial nerve deficit or sensory deficit. He displays no seizure activity.  AAOx3, answering questions and following commands appropriately; equal strength UE and LE bilaterally; CN grossly intact; moves all extremities appropriately without ataxia; no focal neuro deficits or facial asymmetry appreciated  Skin: Skin is warm and dry. No rash noted. He is not diaphoretic.  Psychiatric: He has a normal mood and affect. His behavior is normal. Thought content normal.  Nursing note and vitals reviewed.    ED Treatments / Results  Labs (all labs ordered are listed, but only abnormal results are displayed) Labs Reviewed  BASIC METABOLIC PANEL - Abnormal; Notable for the following components:      Result Value   Potassium 3.4 (*)    Glucose, Bld 110 (*)    Creatinine, Ser 1.30 (*)    All other components within normal limits  ETHANOL - Abnormal; Notable for the following components:   Alcohol, Ethyl (B) 158 (*)    All other components within normal limits  URINALYSIS, ROUTINE W REFLEX MICROSCOPIC - Abnormal; Notable for the following components:   Color, Urine STRAW (*)    All other components within normal limits  CBC  RAPID URINE DRUG SCREEN, HOSP PERFORMED  CBG MONITORING, ED    EKG  EKG Interpretation  Date/Time:  Friday September 25 2017 02:44:06  EST Ventricular Rate:  80 PR Interval:    QRS Duration: 95 QT Interval:  363 QTC Calculation: 419 R Axis:   85 Text Interpretation:  Sinus rhythm RSR' in V1 or V2, right VCD or RVH Nonspecific T wave abnormality When compared with ECG of 07/03/2017, No significant change was found Confirmed by Delora Fuel ([id number]) on 09/25/2017 2:47:20 AM       Radiology No results found.  Procedures Procedures (including critical care time)  Medications Ordered in ED Medications - No data to display   Initial Impression / Assessment and Plan / ED Course  I have reviewed the triage vital signs and the nursing notes.  Pertinent labs & imaging results that were available during my care of  the patient were reviewed by me and considered in my medical decision making (see chart for details).  52 year old male here after witnessed seizure.  This lasted less than 1 minute.  Initially postictal but now back to baseline on arrival to ED.  He is awake, alert, appropriately oriented.  He has no signs of head trauma or tongue laceration.  Reports isolated seizure about 7 or 8 years ago that was related to a head injury while playing football.  Given normal neurologic exam, head CT deferred.  Screening labs pending, patient does have some EtOH on board.  Screening labs overall reassuring, ethanol 158.  UDS is negative.  EKG without any acute ischemic changes.  Patient has remained at neurologic baseline here.  No recurrent seizure activity.  Feel he can be safely discharged.  Recommended follow-up outpatient with neurology, ambulatory referral was placed.  Discussed driving precautions until cleared by neurology.  Discussed plan with patient, he acknowledged understanding and agreed with plan of care.  Return precautions given for new or worsening symptoms.  Final Clinical Impressions(s) / ED Diagnoses   Final diagnoses:  Seizure [facility])    ED Discharge Orders        Ordered    Ambulatory referral to Neurology     Comments:  An appointment is requested in approximately: ASAP   09/25/17 0422       Larene Pickett, PA-C 58/85/02 [id number]    Delora Fuel, MD 28/78/67 [phone]

## 2017-09-25 NOTE — Discharge Instructions (Signed)
Your labs and EKG today looked ok. We recommend close follow-up with neurology.  I have placed referral, they should contact you with appt time/date.  If you do not hear from them within the next 48 hours, please follow-up on this. We do not want you driving until cleared by neurology. Follow-up with your primary care doctor is also recommended.  Return here for any new/acute changes.

## 2017-11-17 DIAGNOSIS — E785 Hyperlipidemia, unspecified: Secondary | ICD-10-CM | POA: Diagnosis not present

## 2017-11-17 DIAGNOSIS — J4521 Mild intermittent asthma with (acute) exacerbation: Secondary | ICD-10-CM | POA: Diagnosis not present

## 2017-11-17 DIAGNOSIS — I10 Essential (primary) hypertension: Secondary | ICD-10-CM | POA: Diagnosis not present

## 2017-11-26 DIAGNOSIS — E785 Hyperlipidemia, unspecified: Secondary | ICD-10-CM | POA: Diagnosis not present

## 2017-11-26 DIAGNOSIS — I10 Essential (primary) hypertension: Secondary | ICD-10-CM | POA: Diagnosis not present

## 2018-01-11 IMAGING — CR DG SHOULDER 2+V*R*
3 series · 3 of 3 positions shown · non-contrast
Comparison: None.

CLINICAL DATA: 50-year-old male with bilateral shoulder pain
greater on the left for the past 3 months. No known injury.

EXAM:
RIGHT SHOULDER - 2+ VIEW

[w shoulder grashey right]
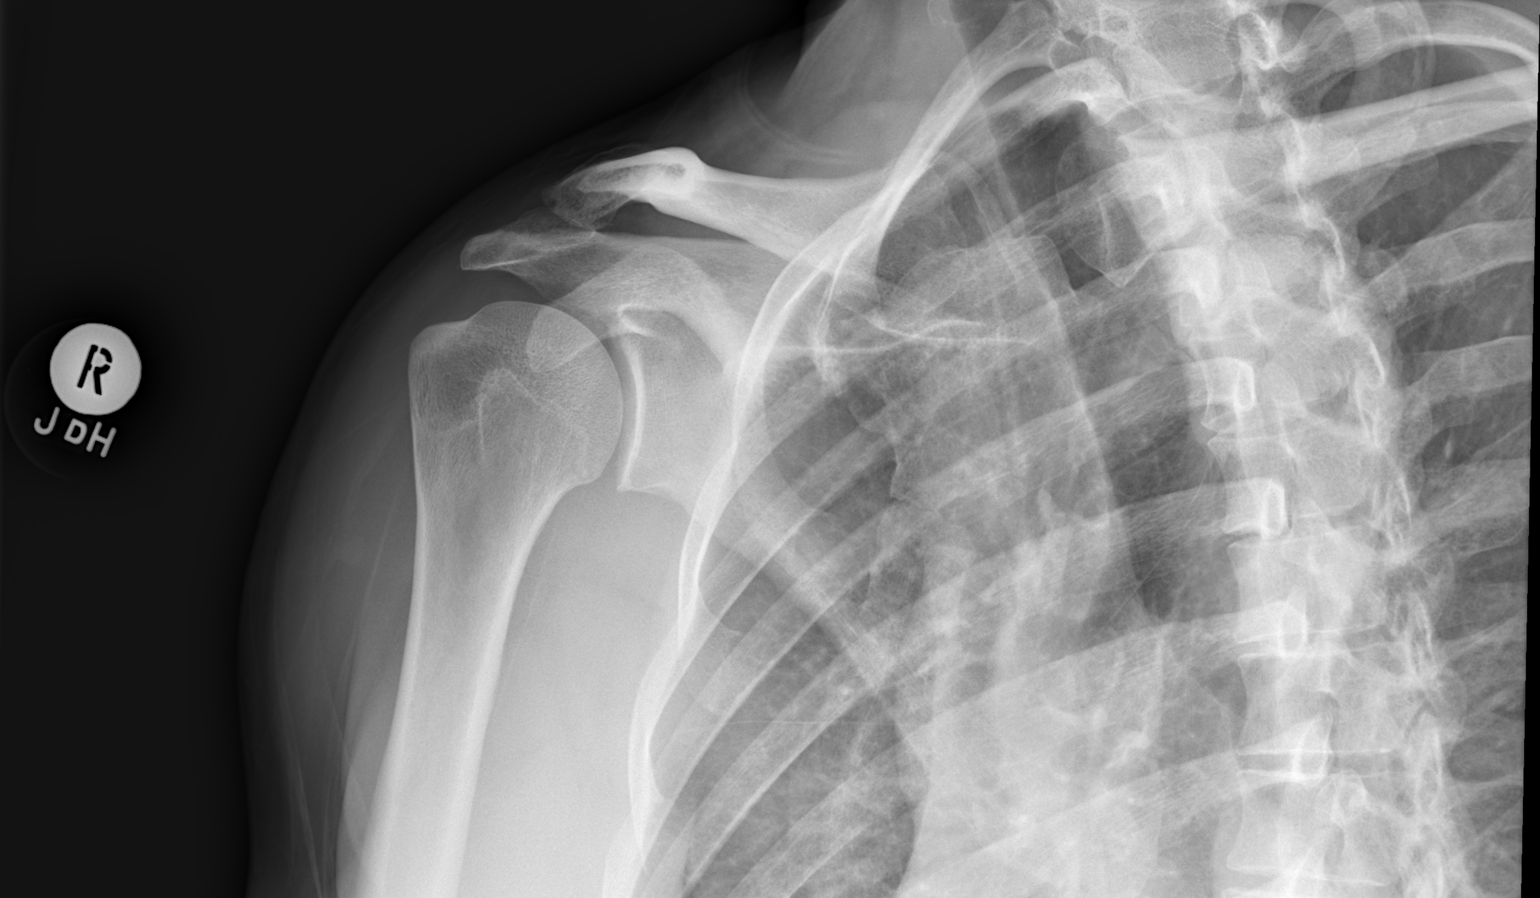

[w shoulder y-view right]
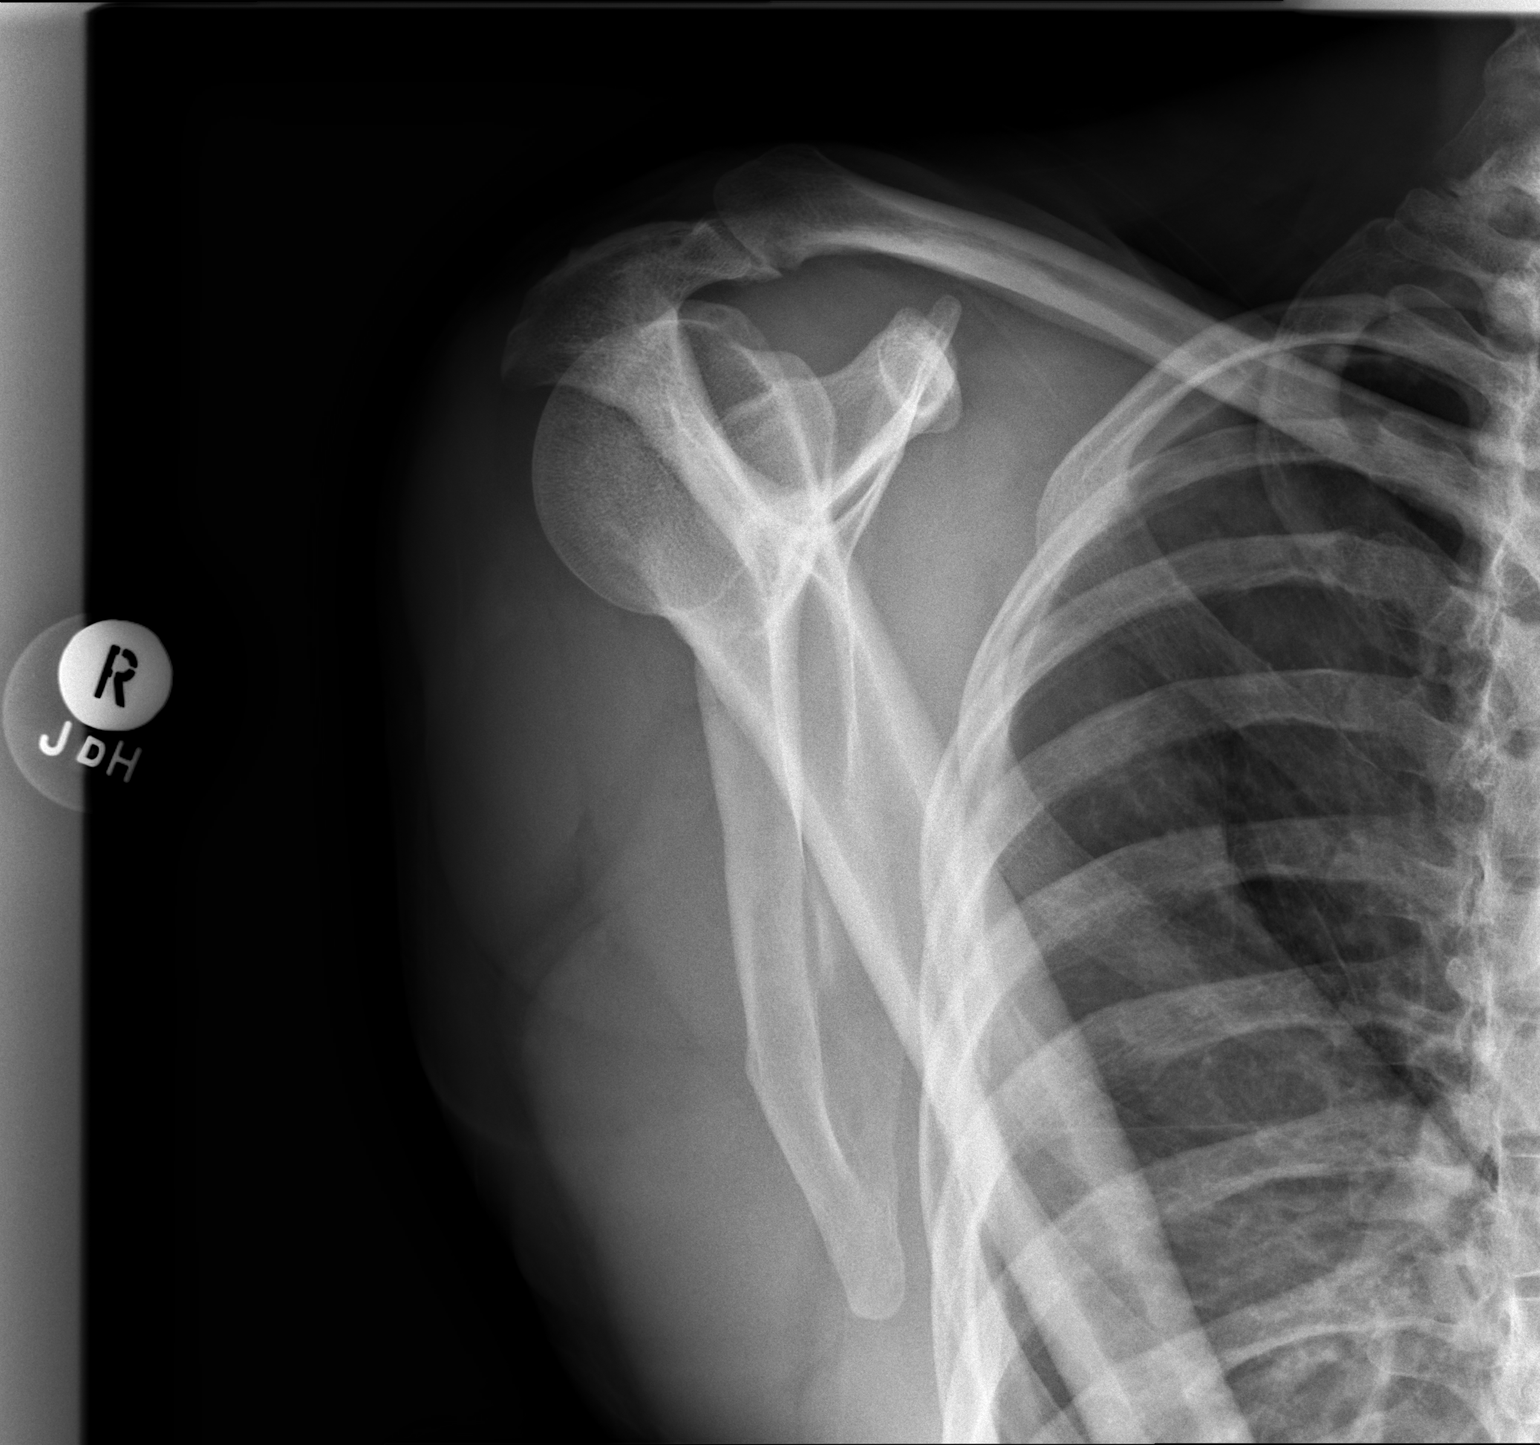

[w shoulder axillary right]
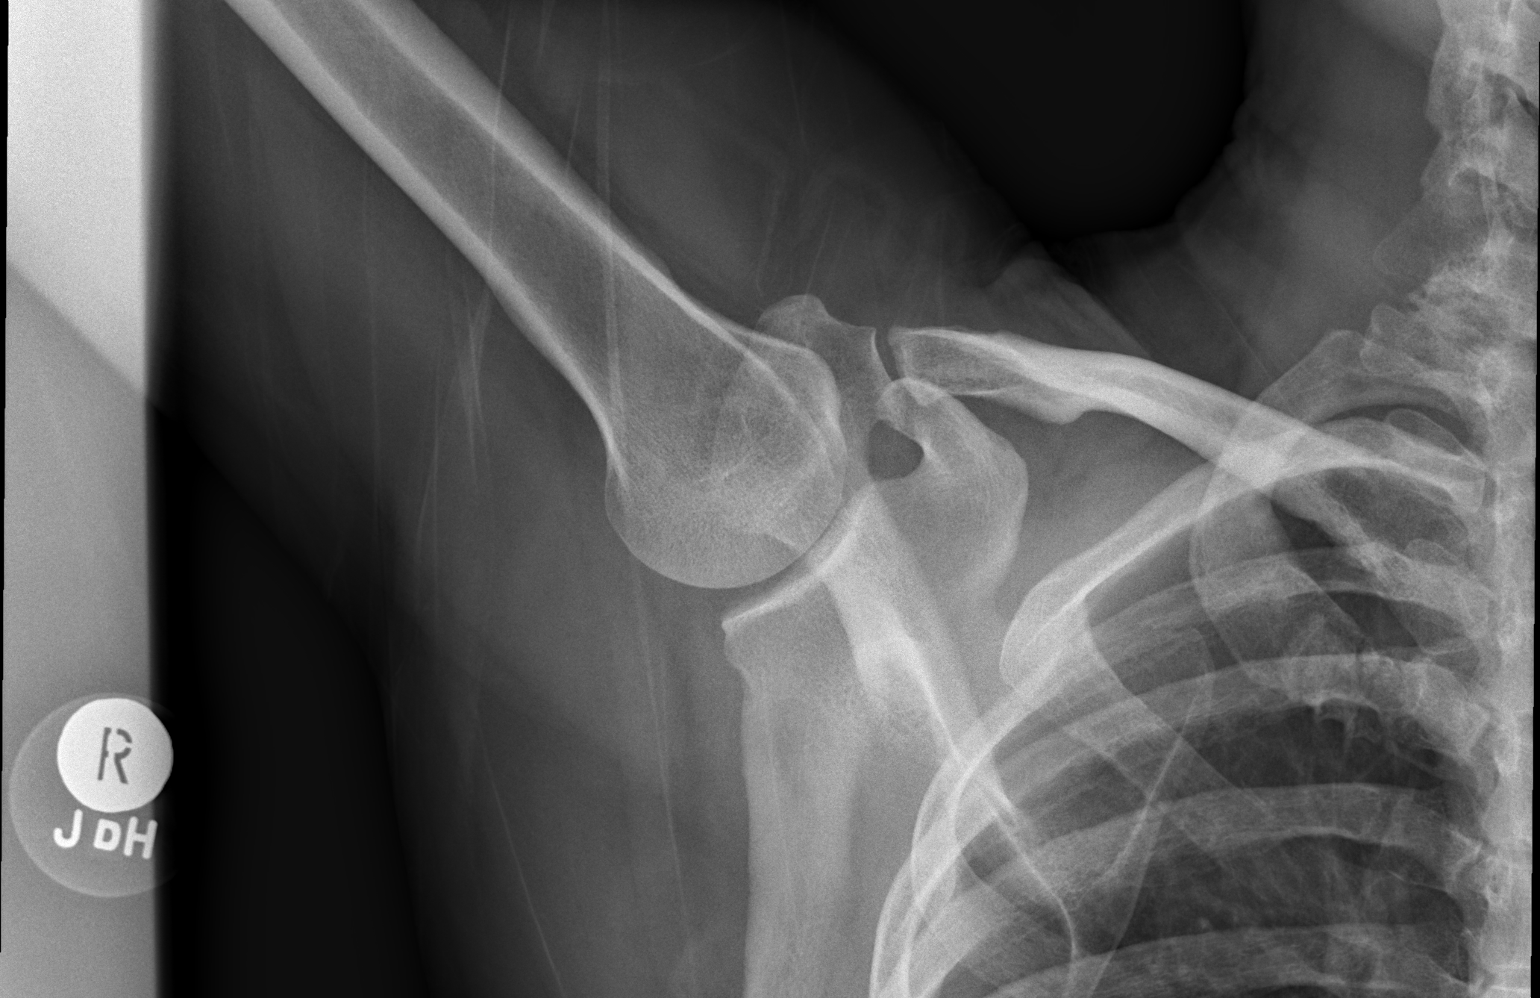

[3 of 3 positions shown; findings below may reference images not displayed]

FINDINGS: Acromioclavicular joint degenerative changes.

Glenohumeral joint is intact.

No abnormal soft tissue calcifications.

No fracture or dislocation.

Visualized lungs clear.
IMPRESSION: Acromioclavicular joint degenerative changes.

## 2018-04-18 ENCOUNTER — Encounter (HOSPITAL_COMMUNITY): Payer: Self-pay | Admitting: Emergency Medicine

## 2018-04-18 ENCOUNTER — Emergency Department (HOSPITAL_COMMUNITY): Payer: BLUE CROSS/BLUE SHIELD

## 2018-04-18 ENCOUNTER — Observation Stay (HOSPITAL_COMMUNITY)
Admission: EM | Admit: 2018-04-18 | Discharge: 2018-04-20 | Disposition: A | Discharge status: Home / Self Care | Payer: BLUE CROSS/BLUE SHIELD | Attending: Internal Medicine | Admitting: Internal Medicine

## 2018-04-18 ENCOUNTER — Other Ambulatory Visit: Payer: Self-pay

## 2018-04-18 DIAGNOSIS — I10 Essential (primary) hypertension: Secondary | ICD-10-CM | POA: Diagnosis present

## 2018-04-18 DIAGNOSIS — E876 Hypokalemia: Secondary | ICD-10-CM | POA: Insufficient documentation

## 2018-04-18 DIAGNOSIS — Z86018 Personal history of other benign neoplasm: Secondary | ICD-10-CM | POA: Insufficient documentation

## 2018-04-18 DIAGNOSIS — Z23 Encounter for immunization: Secondary | ICD-10-CM | POA: Diagnosis not present

## 2018-04-18 DIAGNOSIS — R000 Tachycardia, unspecified: Secondary | ICD-10-CM | POA: Diagnosis not present

## 2018-04-18 DIAGNOSIS — J9601 Acute respiratory failure with hypoxia: Secondary | ICD-10-CM | POA: Diagnosis not present

## 2018-04-18 DIAGNOSIS — R0602 Shortness of breath: Secondary | ICD-10-CM | POA: Diagnosis not present

## 2018-04-18 DIAGNOSIS — R569 Unspecified convulsions: Secondary | ICD-10-CM | POA: Diagnosis not present

## 2018-04-18 DIAGNOSIS — Z79899 Other long term (current) drug therapy: Secondary | ICD-10-CM | POA: Diagnosis not present

## 2018-04-18 DIAGNOSIS — J441 Chronic obstructive pulmonary disease with (acute) exacerbation: Principal | ICD-10-CM | POA: Insufficient documentation

## 2018-04-18 DIAGNOSIS — Z89029 Acquired absence of unspecified finger(s): Secondary | ICD-10-CM | POA: Diagnosis not present

## 2018-04-18 DIAGNOSIS — Z87891 Personal history of nicotine dependence: Secondary | ICD-10-CM | POA: Insufficient documentation

## 2018-04-18 DIAGNOSIS — E7800 Pure hypercholesterolemia, unspecified: Secondary | ICD-10-CM | POA: Insufficient documentation

## 2018-04-18 DIAGNOSIS — Z8249 Family history of ischemic heart disease and other diseases of the circulatory system: Secondary | ICD-10-CM | POA: Insufficient documentation

## 2018-04-18 DIAGNOSIS — R05 Cough: Secondary | ICD-10-CM | POA: Diagnosis not present

## 2018-04-18 DIAGNOSIS — Z9889 Other specified postprocedural states: Secondary | ICD-10-CM | POA: Diagnosis not present

## 2018-04-18 DIAGNOSIS — J45901 Unspecified asthma with (acute) exacerbation: Secondary | ICD-10-CM | POA: Diagnosis present

## 2018-04-18 HISTORY — DX: Unspecified osteoarthritis, unspecified site: M19.90

## 2018-04-18 LAB — CBC WITH DIFFERENTIAL/PLATELET
Abs Immature Granulocytes: 0 10*3/uL (ref 0.0–0.1)
BASOS ABS: 0 10*3/uL (ref 0.0–0.1)
Basophils Relative: 0 %
Eosinophils Absolute: 0.2 10*3/uL (ref 0.0–0.7)
Eosinophils Relative: 2 %
HCT: 44 % (ref 39.0–52.0)
HEMOGLOBIN: 13.8 g/dL (ref 13.0–17.0)
IMMATURE GRANULOCYTES: 0 %
LYMPHS ABS: 1.4 10*3/uL (ref 0.7–4.0)
LYMPHS PCT: 19 %
MCH: 26.6 pg (ref 26.0–34.0)
MCHC: 31.4 g/dL (ref 30.0–36.0)
MCV: 84.9 fL (ref 78.0–100.0)
Monocytes Absolute: 0.7 10*3/uL (ref 0.1–1.0)
Monocytes Relative: 9 %
NEUTROS PCT: 70 %
Neutro Abs: 5.4 10*3/uL (ref 1.7–7.7)
Platelets: 220 10*3/uL (ref 150–400)
RBC: 5.18 MIL/uL (ref 4.22–5.81)
RDW: 13.7 % (ref 11.5–15.5)
WBC: 7.7 10*3/uL (ref 4.0–10.5)

## 2018-04-18 LAB — BASIC METABOLIC PANEL
ANION GAP: 8 (ref 5–15)
BUN: 13 mg/dL (ref 6–20)
CHLORIDE: 107 mmol/L (ref 98–111)
CO2: 22 mmol/L (ref 22–32)
Calcium: 9 mg/dL (ref 8.9–10.3)
Creatinine, Ser: 1.17 mg/dL (ref 0.61–1.24)
Glucose, Bld: 117 mg/dL — ABNORMAL HIGH (ref 70–99)
POTASSIUM: 2.8 mmol/L — AB (ref 3.5–5.1)
Sodium: 137 mmol/L (ref 135–145)

## 2018-04-18 MED ORDER — ALBUTEROL (5 MG/ML) CONTINUOUS INHALATION SOLN
10.0000 mg/h | INHALATION_SOLUTION | Freq: Once | RESPIRATORY_TRACT | Status: AC
  Administered 2018-04-19: 10 mg/h via RESPIRATORY_TRACT
  Filled 2018-04-18: qty 20

## 2018-04-18 MED ORDER — IPRATROPIUM BROMIDE 0.02 % IN SOLN
0.5000 mg | Freq: Once | RESPIRATORY_TRACT | Status: AC
  Administered 2018-04-19: 0.5 mg via RESPIRATORY_TRACT
  Filled 2018-04-18: qty 2.5

## 2018-04-18 MED ORDER — IPRATROPIUM BROMIDE 0.02 % IN SOLN
0.5000 mg | Freq: Once | RESPIRATORY_TRACT | Status: AC
  Administered 2018-04-18: 0.5 mg via RESPIRATORY_TRACT
  Filled 2018-04-18: qty 2.5

## 2018-04-18 MED ORDER — ALBUTEROL SULFATE (2.5 MG/3ML) 0.083% IN NEBU
5.0000 mg | INHALATION_SOLUTION | Freq: Once | RESPIRATORY_TRACT | Status: AC
  Administered 2018-04-18: 5 mg via RESPIRATORY_TRACT
  Filled 2018-04-18: qty 6

## 2018-04-18 MED ORDER — METHYLPREDNISOLONE SODIUM SUCC 125 MG IJ SOLR
125.0000 mg | Freq: Once | INTRAMUSCULAR | Status: AC
  Administered 2018-04-18: 125 mg via INTRAVENOUS
  Filled 2018-04-18: qty 2

## 2018-04-18 MED ORDER — ALBUTEROL (5 MG/ML) CONTINUOUS INHALATION SOLN
10.0000 mg/h | INHALATION_SOLUTION | Freq: Once | RESPIRATORY_TRACT | Status: AC
  Administered 2018-04-18: 10 mg/h via RESPIRATORY_TRACT
  Filled 2018-04-18: qty 20

## 2018-04-18 MED ORDER — MAGNESIUM SULFATE 2 GM/50ML IV SOLN
2.0000 g | Freq: Once | INTRAVENOUS | Status: AC
  Administered 2018-04-18: 2 g via INTRAVENOUS
  Filled 2018-04-18: qty 50

## 2018-04-18 NOTE — ED Notes (Signed)
Pt placed back on 2liter O2 via [place]

## 2018-04-18 NOTE — ED Notes (Signed)
Pt now reports symptoms started yesterday after using chemicals to clean bathroom.

## 2018-04-18 NOTE — ED Triage Notes (Signed)
C/o asthma flare-up with wheezing, SOB, and productive cough with yellow phlegm since Saturday.

## 2018-04-18 NOTE — ED Provider Notes (Addendum)
[place] EMERGENCY DEPARTMENT Provider Note   CSN: [id number] Arrival date & time: 04/18/18  1924     History   Chief Complaint Chief Complaint  Patient presents with  . Asthma    HPI Charles Ruiz is a 52 y.o. male.  Who presents the emergency department with chief complaint of asthma exacerbation.  Is a past medical history of asthma but has never been hospitalized or intubated.  The patient states that he has been having worsening wheezing and difficulty breathing since yesterday.  He has been using his nebulizer and inhaler at home without relief of his symptoms.  Today his breathing got worse and worse and he presented to the emergency department.  Initially the patient was hypoxic 87%.  He is now in the low 90s on 2 L via nasal cannula.  He denies fevers or chills.  He got 1 DuoNeb treatment prior to my evaluation which did not improve his symptoms.  HPI  Past Medical History:  Diagnosis Date  . Asthma   . High cholesterol   . Hypertension   . Seizures [facility])     Patient Active Problem List   Diagnosis Date Noted  . 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 Multiple thyroid nodules 03/17/2012    Past Surgical History:  Procedure Laterality Date  . finger amputation    . THYROID SURGERY          Home Medications    Prior to Admission medications   Medication Sig Start Date End Date Taking? Authorizing Provider  albuterol (PROVENTIL HFA;VENTOLIN HFA) 108 (90 Base) MCG/ACT inhaler Inhale 2 puffs into the lungs every 4 (four) hours as needed for wheezing or shortness of breath. 12/11/16  Yes Wynona Luna, MD  amLODipine (NORVASC) 5 MG tablet Take 5 mg by mouth daily. 04/02/18  Yes [provider]  hydrochlorothiazide (HYDRODIURIL) 25 MG tablet Take 25 mg by mouth daily. 02/12/18  Yes [provider]  predniSONE (DELTASONE) 20 MG tablet Take 3 tablets (60 mg total) by mouth daily. 07/04/17  Yes Ward, Cyril Mourning N, DO    albuterol (PROVENTIL HFA;VENTOLIN HFA) 108 (90 Base) MCG/ACT inhaler Inhale 2 puffs into the lungs every 4 (four) hours as needed for wheezing or shortness of breath. Patient not taking: Reported on 04/18/2018 07/04/17   Ward, Delice Bison, DO  lisinopril-hydrochlorothiazide (PRINZIDE) 10-12.5 MG per tablet Take 1 tablet by mouth daily. Patient not taking: Reported on 04/18/2018 05/15/14   Gregor Hams, MD  mometasone (ELOCON) 0.1 % ointment Apply topically daily. Patient not taking: Reported on 04/18/2018 03/31/16   Jacqualine Mau, NP    Family History Family History  Problem Relation Age of Onset  . Hypertension Mother   . Hypertension Sister   . Hypertension Brother     Social History Social History   Tobacco Use  . Smoking status: Former Smoker    Packs/day: 1.50  . Smokeless tobacco: Never Used  Substance Use Topics  . Alcohol use: Yes    Comment: beer occasionally  . Drug use: No     Allergies   Patient has no known allergies.   Review of Systems Review of Systems  Ten systems reviewed and are negative for acute change, except as noted in the HPI.   Physical Exam Updated Vital Signs BP (!) 144/99   Pulse 100   Temp 98.4 F (36.9 C) (Oral)   Resp (!) 26   SpO2 91%   Physical Exam  Constitutional:  He appears well-developed and well-nourished. No distress.  HENT:  Head: Normocephalic and atraumatic.  Eyes: Conjunctivae are normal. No scleral icterus.  Neck: Normal range of motion. Neck supple.  Cardiovascular: Normal rate, regular rhythm and normal heart sounds.  Pulmonary/Chest: No respiratory distress. He has wheezes.  Patient with poor air movement, sitting on the side of the bed over the guard rail, sitting in tripod position.  Patient is using obvious tertiary breathing muscles.  Patient also has obvious prolonged expiratory phase.  Wheezing is audible but minimal.  Breathing is labored.  Abdominal: Soft. There is no tenderness.  Musculoskeletal: He  exhibits no edema.  Neurological: He is alert.  Skin: Skin is warm and dry. He is not diaphoretic.  Psychiatric: His behavior is normal.  Nursing note and vitals reviewed.    ED Treatments / Results  Labs (all labs ordered are listed, but only abnormal results are displayed) Labs Reviewed  BASIC METABOLIC PANEL - Abnormal; Notable for the following components:      Result Value   Potassium 2.8 (*)    Glucose, Bld 117 (*)    All other components within normal limits  CBC WITH DIFFERENTIAL/PLATELET    EKG None  Radiology Dg Chest Port 1 View  Result Date: 04/18/2018 CLINICAL DATA:  Shortness of breath, cough, wheezing EXAM: PORTABLE CHEST 1 VIEW COMPARISON:  07/29/2017 FINDINGS: Heart and mediastinal contours are within normal limits. No focal opacities or effusions. No acute bony abnormality. IMPRESSION: No active disease. Electronically Signed   By: Rolm Baptise M.D.   On: 04/18/2018 21:54    Procedures .Critical Care Performed by: Margarita Mail, PA-C Authorized by: Margarita Mail, PA-C   Critical care provider statement:    Critical care time (minutes):  45   Critical care was necessary to treat or prevent imminent or life-threatening deterioration of the following conditions:  Respiratory failure   Critical care was time spent personally by me on the following activities:  Discussions with consultants, evaluation of patient's response to treatment, examination of patient, ordering and performing treatments and interventions, ordering and review of laboratory studies, ordering and review of radiographic studies, pulse oximetry, re-evaluation of patient's condition, obtaining history from patient or surrogate and review of old charts   (including critical care time)  Medications Ordered in ED Medications  albuterol (PROVENTIL,VENTOLIN) solution continuous neb (has no administration in time range)  ipratropium (ATROVENT) nebulizer solution 0.5 mg (has no administration  in time range)  potassium chloride 10 mEq in 100 mL IVPB (has no administration in time range)  albuterol (PROVENTIL) (2.5 MG/3ML) 0.083% nebulizer solution 5 mg (5 mg Nebulization Given 04/18/18 1945)  methylPREDNISolone sodium succinate (SOLU-MEDROL) 125 mg/2 mL injection 125 mg (125 mg Intravenous Given 04/18/18 2153)  magnesium sulfate IVPB 2 g 50 mL (0 g Intravenous Stopped 04/18/18 2317)  ipratropium (ATROVENT) nebulizer solution 0.5 mg (0.5 mg Nebulization Given 04/18/18 2151)  albuterol (PROVENTIL,VENTOLIN) solution continuous neb (10 mg/hr Nebulization Given 04/18/18 2151)     Initial Impression / Assessment and Plan / ED Course  I have reviewed the triage vital signs and the nursing notes.  Pertinent labs & imaging results that were available during my care of the patient were reviewed by me and considered in my medical decision making (see chart for details).  Clinical Course as of Apr 19 29  Sun Apr 18, 2018  2201 Patient with significant asthma exacerbation.  Hypoxic on room air.  He is keeping his saturations up above 90 on nasal  cannula but is very labored.  I have ordered an hour-long neb treatment including IV magnesium and Solu-Medrol.   [AH]  2202 Patient's 1 view chest x-ray negative.  Personally reviewed the films and agree with radiologic interpretation   [AH]  2327 Patient moving air. Patient got up to ambulate and began coughing, wheezing, and became very short of breath again. I have ordered another hour long neb.   [AH]    Clinical Course User Index [AH] Margarita Mail, PA-C    Patient with acute asthma exacerbation.  He breathing is still poor.  He will get another hour-long nebulizer treatment.  His potassium is low and he is getting 1 run of IV potassium as well as 40 M EQ orally.  Patient given in sign out to PA up still.  He is stable throughout his visit.  Final Clinical Impressions(s) / ED Diagnoses   Final diagnoses:  None    ED Discharge Orders     None       Margarita Mail, PA-C 04/19/18 0032    Gareth Morgan, MD 04/19/18 2106    Margarita Mail, PA-C 05/01/18 1302    Gareth Morgan, MD 05/03/18 2125

## 2018-04-19 ENCOUNTER — Encounter (HOSPITAL_COMMUNITY): Payer: Self-pay | Admitting: Internal Medicine

## 2018-04-19 ENCOUNTER — Other Ambulatory Visit: Payer: Self-pay

## 2018-04-19 DIAGNOSIS — I10 Essential (primary) hypertension: Secondary | ICD-10-CM | POA: Diagnosis present

## 2018-04-19 DIAGNOSIS — J4521 Mild intermittent asthma with (acute) exacerbation: Secondary | ICD-10-CM | POA: Diagnosis not present

## 2018-04-19 DIAGNOSIS — R000 Tachycardia, unspecified: Secondary | ICD-10-CM | POA: Diagnosis not present

## 2018-04-19 DIAGNOSIS — J441 Chronic obstructive pulmonary disease with (acute) exacerbation: Secondary | ICD-10-CM | POA: Diagnosis not present

## 2018-04-19 DIAGNOSIS — J9601 Acute respiratory failure with hypoxia: Secondary | ICD-10-CM | POA: Diagnosis present

## 2018-04-19 DIAGNOSIS — J45901 Unspecified asthma with (acute) exacerbation: Secondary | ICD-10-CM | POA: Diagnosis present

## 2018-04-19 DIAGNOSIS — E876 Hypokalemia: Secondary | ICD-10-CM | POA: Diagnosis not present

## 2018-04-19 LAB — BASIC METABOLIC PANEL
ANION GAP: 19 — AB (ref 5–15)
BUN: 12 mg/dL (ref 6–20)
CALCIUM: 9.4 mg/dL (ref 8.9–10.3)
CO2: 17 mmol/L — ABNORMAL LOW (ref 22–32)
Chloride: 103 mmol/L (ref 98–111)
Creatinine, Ser: 1.42 mg/dL — ABNORMAL HIGH (ref 0.61–1.24)
GFR, EST NON AFRICAN AMERICAN: 55 mL/min — AB (ref >60)
GLUCOSE: 218 mg/dL — AB (ref 70–99)
POTASSIUM: 3.2 mmol/L — AB (ref 3.5–5.1)
Sodium: 139 mmol/L (ref 135–145)

## 2018-04-19 LAB — CBC
HEMATOCRIT: 43.1 % (ref 39.0–52.0)
HEMOGLOBIN: 13.7 g/dL (ref 13.0–17.0)
MCH: 27 pg (ref 26.0–34.0)
MCHC: 31.8 g/dL (ref 30.0–36.0)
MCV: 84.8 fL (ref 78.0–100.0)
Platelets: 208 10*3/uL (ref 150–400)
RBC: 5.08 MIL/uL (ref 4.22–5.81)
RDW: 13.8 % (ref 11.5–15.5)
WBC: 8.9 10*3/uL (ref 4.0–10.5)

## 2018-04-19 LAB — HIV ANTIBODY (ROUTINE TESTING W REFLEX): HIV SCREEN 4TH GENERATION: NONREACTIVE

## 2018-04-19 LAB — MAGNESIUM: MAGNESIUM: 2.4 mg/dL (ref 1.7–2.4)

## 2018-04-19 MED ORDER — POTASSIUM CHLORIDE CRYS ER 20 MEQ PO TBCR
30.0000 meq | EXTENDED_RELEASE_TABLET | Freq: Once | ORAL | Status: AC
  Administered 2018-04-19: 30 meq via ORAL
  Filled 2018-04-19: qty 1

## 2018-04-19 MED ORDER — PREDNISONE 20 MG PO TABS
40.0000 mg | ORAL_TABLET | Freq: Every day | ORAL | Status: DC
  Administered 2018-04-20: 40 mg via ORAL
  Filled 2018-04-19: qty 2

## 2018-04-19 MED ORDER — FLUTICASONE FUROATE-VILANTEROL 100-25 MCG/INH IN AEPB
1.0000 | INHALATION_SPRAY | Freq: Every day | RESPIRATORY_TRACT | Status: DC
  Administered 2018-04-20: 1 via RESPIRATORY_TRACT
  Filled 2018-04-19: qty 28

## 2018-04-19 MED ORDER — POTASSIUM CHLORIDE CRYS ER 20 MEQ PO TBCR
40.0000 meq | EXTENDED_RELEASE_TABLET | Freq: Once | ORAL | Status: AC
  Administered 2018-04-19: 40 meq via ORAL
  Filled 2018-04-19: qty 2

## 2018-04-19 MED ORDER — ALBUTEROL SULFATE (2.5 MG/3ML) 0.083% IN NEBU
2.5000 mg | INHALATION_SOLUTION | RESPIRATORY_TRACT | Status: DC
  Administered 2018-04-19: 2.5 mg via RESPIRATORY_TRACT
  Filled 2018-04-19: qty 3

## 2018-04-19 MED ORDER — IPRATROPIUM BROMIDE 0.02 % IN SOLN
0.5000 mg | RESPIRATORY_TRACT | Status: DC
  Administered 2018-04-19: 0.5 mg via RESPIRATORY_TRACT
  Filled 2018-04-19: qty 2.5

## 2018-04-19 MED ORDER — ENOXAPARIN SODIUM 40 MG/0.4ML ~~LOC~~ SOLN
40.0000 mg | SUBCUTANEOUS | Status: DC
  Administered 2018-04-19: 40 mg via SUBCUTANEOUS
  Filled 2018-04-19: qty 0.4

## 2018-04-19 MED ORDER — ONDANSETRON HCL 4 MG PO TABS: 4.0000 mg | ORAL_TABLET | Freq: Four times a day (QID) | ORAL | Status: DC | PRN

## 2018-04-19 MED ORDER — ACETAMINOPHEN 325 MG PO TABS
650.0000 mg | ORAL_TABLET | Freq: Four times a day (QID) | ORAL | Status: DC | PRN
  Administered 2018-04-19: 650 mg via ORAL
  Filled 2018-04-19: qty 2

## 2018-04-19 MED ORDER — POTASSIUM CHLORIDE 10 MEQ/100ML IV SOLN
10.0000 meq | Freq: Once | INTRAVENOUS | Status: AC
  Administered 2018-04-19: 10 meq via INTRAVENOUS
  Filled 2018-04-19: qty 100

## 2018-04-19 MED ORDER — BUDESONIDE 0.25 MG/2ML IN SUSP
0.2500 mg | Freq: Two times a day (BID) | RESPIRATORY_TRACT | Status: DC
  Administered 2018-04-19: 0.25 mg via RESPIRATORY_TRACT
  Filled 2018-04-19 (×2): qty 2

## 2018-04-19 MED ORDER — ONDANSETRON HCL 4 MG/2ML IJ SOLN: 4.0000 mg | Freq: Four times a day (QID) | INTRAMUSCULAR | Status: DC | PRN

## 2018-04-19 MED ORDER — INFLUENZA VAC SPLIT QUAD 0.5 ML IM SUSY
0.5000 mL | PREFILLED_SYRINGE | INTRAMUSCULAR | Status: AC
  Administered 2018-04-20: 0.5 mL via INTRAMUSCULAR
  Filled 2018-04-19: qty 0.5

## 2018-04-19 MED ORDER — IPRATROPIUM-ALBUTEROL 0.5-2.5 (3) MG/3ML IN SOLN: 3.0000 mL | Freq: Three times a day (TID) | RESPIRATORY_TRACT | Status: DC

## 2018-04-19 MED ORDER — AMLODIPINE BESYLATE 5 MG PO TABS
5.0000 mg | ORAL_TABLET | Freq: Every day | ORAL | Status: DC
  Administered 2018-04-19 – 2018-04-20 (×2): 5 mg via ORAL
  Filled 2018-04-19 (×2): qty 1

## 2018-04-19 MED ORDER — ALBUTEROL SULFATE (2.5 MG/3ML) 0.083% IN NEBU: 2.5000 mg | INHALATION_SOLUTION | RESPIRATORY_TRACT | Status: DC | PRN

## 2018-04-19 MED ORDER — IPRATROPIUM-ALBUTEROL 0.5-2.5 (3) MG/3ML IN SOLN: 3.0000 mL | Freq: Four times a day (QID) | RESPIRATORY_TRACT | Status: DC | PRN

## 2018-04-19 MED ORDER — ACETAMINOPHEN 650 MG RE SUPP: 650.0000 mg | Freq: Four times a day (QID) | RECTAL | Status: DC | PRN

## 2018-04-19 MED ORDER — HYDROCHLOROTHIAZIDE 25 MG PO TABS
25.0000 mg | ORAL_TABLET | Freq: Every day | ORAL | Status: DC
  Administered 2018-04-19 – 2018-04-20 (×2): 25 mg via ORAL
  Filled 2018-04-19 (×2): qty 1

## 2018-04-19 MED ORDER — METHYLPREDNISOLONE SODIUM SUCC 40 MG IJ SOLR
40.0000 mg | Freq: Two times a day (BID) | INTRAMUSCULAR | Status: DC
  Administered 2018-04-19: 40 mg via INTRAVENOUS
  Filled 2018-04-19: qty 1

## 2018-04-19 MED ORDER — IPRATROPIUM-ALBUTEROL 0.5-2.5 (3) MG/3ML IN SOLN
3.0000 mL | RESPIRATORY_TRACT | Status: DC
  Administered 2018-04-19: 3 mL via RESPIRATORY_TRACT
  Filled 2018-04-19: qty 3

## 2018-04-19 NOTE — Progress Notes (Signed)
SATURATION QUALIFICATIONS: (This note is used to comply with regulatory documentation for home oxygen)  Patient Saturations on Room Air at Rest = HR 116 95%  Patient Saturations on Room Air while Ambulating =  HR 122 95%  Patient Saturations on 0 Liters of oxygen while Ambulating = HR 118 96%  Please briefly explain why patient needs home oxygen: No shortness of breath while walking.  Marcille Blanco, RN

## 2018-04-19 NOTE — H&P (Signed)
History and Physical    Charles Ruiz [id number]:[id number] DOB: Mar 06, 1966 DOA: 04/18/2018  PCP: Maude Leriche, PA-C  Patient coming from: Home.  Chief Complaint: Shortness of breath.  HPI: Charles Ruiz is a 52 y.o. male with history of recently diagnosed asthma in February of this year the patient by his primary care physician when he had a bronchitis attack and usually uses Breo which she was not able to refill 2 days ago because of insurance issues started having increasing wheezing and shortness of breath for the last 2 days.  Denies any chest pain fever chills.  Has some minimal productive cough with white sputum.  Since patient had persistent wheezing patient presented to the ER.  ED Course: The ER patient was found to be having diffuse wheezing and was given steroids nebulizer treatment.  Chest x-ray did not show any acute infiltrates.  Patient was hypoxic requiring 3 L oxygen and admitted for further observation.  Review of Systems: As per HPI, rest all negative.   Past Medical History:  Diagnosis Date  . Asthma   . High cholesterol   . Hypertension   . Seizures ([place])     Past Surgical History:  Procedure Laterality Date  . finger amputation    . THYROID SURGERY       reports that he has quit smoking. He smoked 1.50 packs per day. He has never used smokeless tobacco. He reports that he drinks alcohol. He reports that he does not use drugs.  No Known Allergies  Family History  Problem Relation Age of Onset  . Hypertension Mother   . Hypertension Sister   . Hypertension Brother     Prior to Admission medications   Medication Sig Start Date End Date Taking? Authorizing Provider  albuterol (PROVENTIL HFA;VENTOLIN HFA) 108 (90 Base) MCG/ACT inhaler Inhale 2 puffs into the lungs every 4 (four) hours as needed for wheezing or shortness of breath. 12/11/16  Yes Wynona Luna, MD  amLODipine (NORVASC) 5 MG tablet Take 5 mg by mouth daily. 04/02/18  Yes [provider]  hydrochlorothiazide (HYDRODIURIL) 25 MG tablet Take 25 mg by mouth daily. 02/12/18  Yes [provider]  predniSONE (DELTASONE) 20 MG tablet Take 3 tablets (60 mg total) by mouth daily. 07/04/17  Yes Ward, Cyril Mourning N, DO  albuterol (PROVENTIL HFA;VENTOLIN HFA) 108 (90 Base) MCG/ACT inhaler Inhale 2 puffs into the lungs every 4 (four) hours as needed for wheezing or shortness of breath. Patient not taking: Reported on 04/18/2018 07/04/17   Ward, Delice Bison, DO  lisinopril-hydrochlorothiazide (PRINZIDE) 10-12.5 MG per tablet Take 1 tablet by mouth daily. Patient not taking: Reported on 04/18/2018 05/15/14   Gregor Hams, MD  mometasone (ELOCON) 0.1 % ointment Apply topically daily. Patient not taking: Reported on 04/18/2018 03/31/16   Jacqualine Mau, NP    Physical Exam: Vitals:   04/19/18 0130 04/19/18 0145 04/19/18 0154 04/19/18 0200  BP: 138/81 131/90  (!) 142/92  Pulse: (!) 120 (!) 110 (!) 111 (!) 109  Resp: (!) 23 (!) 22 (!) 22 18  Temp:      TempSrc:      SpO2: 96% 95% (S) (!) 87% 93%  Weight:      Height:          Constitutional: Moderately built and nourished. Vitals:   04/19/18 0130 04/19/18 0145 04/19/18 0154 04/19/18 0200  BP: 138/81 131/90  (!) 142/92  Pulse: (!) 120 (!) 110 (!) 111 (!) 109  Resp: Marland Kitchen)  23 (!) 22 (!) 22 18  Temp:      TempSrc:      SpO2: 96% 95% (S) (!) 87% 93%  Weight:      Height:       Eyes: Anicteric no pallor. ENMT: No discharge from the ears eyes nose or mouth. Neck: No mass felt.  No neck rigidity but no JVD appreciated. Respiratory: Mild expiratory wheeze and no crepitations. Cardiovascular: S1-S2 heard no murmurs appreciated. Abdomen: Soft nontender bowel sounds present. Musculoskeletal: No edema.  No joint effusion. Skin: No rash. Neurologic: Alert awake oriented to time place and person.  Moves all extremities. Psychiatric: Appears normal per normal affect.   Labs on Admission: I have personally reviewed  following labs and imaging studies  CBC: Recent Labs  Lab 04/18/18 2158  WBC 7.7  NEUTROABS 5.4  HGB 13.8  HCT 44.0  MCV 84.9  PLT 326   Basic Metabolic Panel: Recent Labs  Lab 04/18/18 2158  NA 137  K 2.8*  CL 107  CO2 22  GLUCOSE 117*  BUN 13  CREATININE 1.17  CALCIUM 9.0   GFR: Estimated Creatinine Clearance: 78.7 mL/min (by C-G formula based on SCr of 1.17 mg/dL). Liver Function Tests: No results for input(s): AST, ALT, ALKPHOS, BILITOT, PROT, ALBUMIN in the last 168 hours. No results for input(s): LIPASE, AMYLASE in the last 168 hours. No results for input(s): AMMONIA in the last 168 hours. Coagulation Profile: No results for input(s): INR, PROTIME in the last 168 hours. Cardiac Enzymes: No results for input(s): CKTOTAL, CKMB, CKMBINDEX, TROPONINI in the last 168 hours. BNP (last 3 results) No results for input(s): PROBNP in the last 8760 hours. HbA1C: No results for input(s): HGBA1C in the last 72 hours. CBG: No results for input(s): GLUCAP in the last 168 hours. Lipid Profile: No results for input(s): CHOL, HDL, LDLCALC, TRIG, CHOLHDL, LDLDIRECT in the last 72 hours. Thyroid Function Tests: No results for input(s): TSH, T4TOTAL, FREET4, T3FREE, THYROIDAB in the last 72 hours. Anemia Panel: No results for input(s): VITAMINB12, FOLATE, FERRITIN, TIBC, IRON, RETICCTPCT in the last 72 hours. Urine analysis:    Component Value Date/Time   COLORURINE STRAW (A) 09/25/2017 0214   APPEARANCEUR CLEAR 09/25/2017 0214   LABSPEC 1.009 09/25/2017 0214   PHURINE 6.0 09/25/2017 0214   GLUCOSEU NEGATIVE 09/25/2017 0214   HGBUR NEGATIVE 09/25/2017 0214   BILIRUBINUR NEGATIVE 09/25/2017 0214   KETONESUR NEGATIVE 09/25/2017 0214   PROTEINUR NEGATIVE 09/25/2017 0214   NITRITE NEGATIVE 09/25/2017 0214   LEUKOCYTESUR NEGATIVE 09/25/2017 0214   Sepsis Labs: @LABRCNTIP (procalcitonin:4,lacticidven:4) )No results found for this or any previous visit (from the past 240  hour(s)).   Radiological Exams on Admission: Dg Chest Port 1 View  Result Date: 04/18/2018 CLINICAL DATA:  Shortness of breath, cough, wheezing EXAM: PORTABLE CHEST 1 VIEW COMPARISON:  07/29/2017 FINDINGS: Heart and mediastinal contours are within normal limits. No focal opacities or effusions. No acute bony abnormality. IMPRESSION: No active disease. Electronically Signed   By: Rolm Baptise M.D.   On: 04/18/2018 21:54     Assessment/Plan Principal Problem:   Acute respiratory failure with hypoxia (HCC) Active Problems:   COPD exacerbation (HCC)   Essential hypertension    1. Acute respiratory failure with hypoxia likely from asthma exacerbation -since patient required 3 L oxygen to maintain oxygen sats more than 90 will observe patient overnight.  Keep patient on Solu-Medrol nebulizer Pulmicort. 2. Sinus tachycardia likely from nebulizer treatment.  EKG is pending. 3. Hypertension on  amlodipine and hydrochlorothiazide.  Which will be continued.   DVT prophylaxis: Lovenox. Code Status: Full code. Family Communication: Discussed with patient. Disposition Plan: Home. Consults called: None. Admission status: Observation.   Rise Patience MD [facility] Pager [phone].  If 7PM-7AM, please contact night-coverage [email or web address] Password [facility]  04/19/2018, 2:36 AM

## 2018-04-19 NOTE — Progress Notes (Signed)
Start pt on second CAT. Ordered 10mg /hr for 1 hour. Pt tolerating well. RT will continue to monitor.

## 2018-04-19 NOTE — ED Provider Notes (Signed)
Patient signed out at end of shift by Margarita Mail, PA-C. H/o asthma, former smoker, ?COPD He reports no real pulmonary problems til the last 2 years Exacerbation of wheezing and SOB starting yesterday Trip[place] on initial exam here O2 sat 90% on 2L CAT, mag, solumedrol - improved Coughing, increased wheezing and SOB with light ambulation 2nd CAT ordered and will need re-eavl  Anticipate admisison  Re-eval: patient reports he is feeling better after 2nd continous nebulizer. He is on 4L O2 via [place] and his monitored O2 saturations are 90-91%, decreasing to 89-90% when speaking. O2 turned off and without ambulation the patient's oxygenation decreases to 87%.   Discussed the patient's presentation with Dr. Hal Hope who accepts him on to his service for admission.    Charlann Lange, PA-C 04/19/18 2263    Gareth Morgan, MD 04/19/18 2107

## 2018-04-19 NOTE — Progress Notes (Signed)
PROGRESS NOTE  Sachin Ferencz ZWC:[id number] DOB: May 03, 1966 DOA: 04/18/2018 PCP: Maude Leriche, PA-C  HPI/Brief Narrative  Charles Ruiz is a 52 y.o. year old male with medical history significant for recently diagnosed asthma ( 08/2017) who presented on 04/18/2018 with worsening wheezing and shortness of breath and was found to have asthma exacerbation.  Symptoms started on Saturday with some problems breathing and he noticed himself wheezing. Unfortunately he had ran out of his prescription for Breo the day prior and when he went to pharmacy to obtain the price was too expensive for him to get ( of note he has been using this medication for a few months and states not having a copay).  His symptoms did not improve despite using his nebulizer machine up to 4-5 times at home so he drove himself to the ED.  Denies any history of blood clots, family history of blood clots, fevers, chills, or cough.   In the ED, given her diffuse wheezing she was given steroids via nebulizer treatment. She was hypoxic to 87% on room air requiring 2 L O2.  CXR showed no acute abnormalities  Subjective Feels much better breathing.   Assessment/Plan:  #Acute hypoxic respiratory failure secondary to asthma exacerbation.  Doing much better with respiratory effort after nebulized treatments overnight and this am. Wean o2 to room air and ambulate in halls  #Asthma exacerbation. Related to poor adherence to inhaler regimen. Transition from IV solumedrol to prednisione for four days. PRN nebulized albuterol and transition back to home breio  #Sinus Tachycardia, improving. Likely secondary to breathing treatments (albuterol component). No concerning EKG findings. Low suspicion for PE with no chest pain, no leg edema, no family history and can be explained due to above.   #Hypokalemia. Likely related to home lisinopril/HCTZ Oral repletion . Monitor on BMP   #HTN, at goal. Continue home amlodipine and HCTZ.     Code  Status: Full code   Family Communication: wife at bedside   Disposition Plan: weaning O2, tolerate home breathing treatment regimen    Consultants:  none     Procedures:  none   Antimicrobials: Anti-infectives (From admission, onward)   None         Cultures:  none  Telemetry:yes  DVT prophylaxis:  Lovenox   Objective: Vitals:   04/19/18 0345 04/19/18 0400 04/19/18 0423 04/19/18 0824  BP:   (!) 145/82   Pulse: (!) 117 (!) 112 (!) 111   Resp: (!) 24 18 20    Temp:   97.8 F (36.6 C)   TempSrc:   Oral   SpO2: 95% 96% 98% 97%  Weight:   77.1 kg   Height:   5\' 11"  (1.803 m)     Intake/Output Summary (Last 24 hours) at 04/19/2018 0832 Last data filed at 04/19/2018 0430 Gross per 24 hour  Intake 400 ml  Output 850 ml  Net -450 ml   Filed Weights   04/19/18 0029 04/19/18 0423  Weight: 77.1 kg 77.1 kg    Exam:  Constitutional:normal appearing male3 Eyes: EOMI, anicteric, normal conjunctivae ENMT: Oropharynx with moist mucous membranes, normal dentition Cardiovascular: Tachycardic, normal rhythm no MRGs, with no peripheral edema Respiratory: Normal respiratory effort on 2 L, clear breath sounds  Abdomen: Soft,non-tender, with no HSM Skin: No rash ulcers, or lesions. Without skin tenting  Neurologic: Grossly no focal neuro deficit. Psychiatric:Appropriate affect, and mood. Mental status AAOx3  Data Reviewed: CBC: Recent Labs  Lab 04/18/18 2158 04/19/18 0318  WBC 7.7 8.9  NEUTROABS 5.4  --   HGB 13.8 13.7  HCT 44.0 43.1  MCV 84.9 84.8  PLT 220 944   Basic Metabolic Panel: Recent Labs  Lab 04/18/18 2158 04/19/18 0318  NA 137 139  K 2.8* 3.2*  CL 107 103  CO2 22 17*  GLUCOSE 117* 218*  BUN 13 12  CREATININE 1.17 1.42*  CALCIUM 9.0 9.4   GFR: Estimated Creatinine Clearance: 64.8 mL/min (A) (by C-G formula based on SCr of 1.42 mg/dL (H)). Liver Function Tests: No results for input(s): AST, ALT, ALKPHOS, BILITOT, PROT, ALBUMIN in  the last 168 hours. No results for input(s): LIPASE, AMYLASE in the last 168 hours. No results for input(s): AMMONIA in the last 168 hours. Coagulation Profile: No results for input(s): INR, PROTIME in the last 168 hours. Cardiac Enzymes: No results for input(s): CKTOTAL, CKMB, CKMBINDEX, TROPONINI in the last 168 hours. BNP (last 3 results) No results for input(s): PROBNP in the last 8760 hours. HbA1C: No results for input(s): HGBA1C in the last 72 hours. CBG: No results for input(s): GLUCAP in the last 168 hours. Lipid Profile: No results for input(s): CHOL, HDL, LDLCALC, TRIG, CHOLHDL, LDLDIRECT in the last 72 hours. Thyroid Function Tests: No results for input(s): TSH, T4TOTAL, FREET4, T3FREE, THYROIDAB in the last 72 hours. Anemia Panel: No results for input(s): VITAMINB12, FOLATE, FERRITIN, TIBC, IRON, RETICCTPCT in the last 72 hours. Urine analysis:    Component Value Date/Time   COLORURINE STRAW (A) 09/25/2017 0214   APPEARANCEUR CLEAR 09/25/2017 0214   LABSPEC 1.009 09/25/2017 0214   PHURINE 6.0 09/25/2017 0214   GLUCOSEU NEGATIVE 09/25/2017 0214   HGBUR NEGATIVE 09/25/2017 0214   BILIRUBINUR NEGATIVE 09/25/2017 0214   KETONESUR NEGATIVE 09/25/2017 0214   PROTEINUR NEGATIVE 09/25/2017 0214   NITRITE NEGATIVE 09/25/2017 0214   LEUKOCYTESUR NEGATIVE 09/25/2017 0214   Sepsis Labs: @LABRCNTIP (procalcitonin:4,lacticidven:4)  )No results found for this or any previous visit (from the past 240 hour(s)).    Studies: Dg Chest Port 1 View  Result Date: 04/18/2018 CLINICAL DATA:  Shortness of breath, cough, wheezing EXAM: PORTABLE CHEST 1 VIEW COMPARISON:  07/29/2017 FINDINGS: Heart and mediastinal contours are within normal limits. No focal opacities or effusions. No acute bony abnormality. IMPRESSION: No active disease. Electronically Signed   By: Rolm Baptise M.D.   On: 04/18/2018 21:54    Scheduled Meds: . amLODipine  5 mg Oral Daily  . budesonide (PULMICORT)  nebulizer solution  0.25 mg Nebulization BID  . enoxaparin (LOVENOX) injection  40 mg Subcutaneous Q24H  . hydrochlorothiazide  25 mg Oral Daily  . ipratropium-albuterol  3 mL Nebulization TID  . methylPREDNISolone (SOLU-MEDROL) injection  40 mg Intravenous Q12H  . potassium chloride  40 mEq Oral Once    Continuous Infusions:   LOS: 0 days     Desiree Hane, MD [facility] Pager [phone]  If 7PM-7AM, please contact night-coverage [email or web address] Password [facility] 04/19/2018, 8:32 AM

## 2018-04-20 DIAGNOSIS — E876 Hypokalemia: Secondary | ICD-10-CM | POA: Diagnosis not present

## 2018-04-20 DIAGNOSIS — J9601 Acute respiratory failure with hypoxia: Secondary | ICD-10-CM | POA: Diagnosis not present

## 2018-04-20 DIAGNOSIS — J4521 Mild intermittent asthma with (acute) exacerbation: Secondary | ICD-10-CM | POA: Diagnosis not present

## 2018-04-20 NOTE — Plan of Care (Signed)
  Problem: Health Behavior/Discharge Planning: Goal: Ability to manage health-related needs will improve Outcome: Completed/Met   Problem: Clinical Measurements: Goal: Respiratory complications will improve Outcome: Completed/Met

## 2018-04-20 NOTE — Progress Notes (Signed)
To the best of my knowledge, documentation by Louann Liv, [employer] nursing student is correct.

## 2018-04-20 NOTE — Progress Notes (Signed)
RT NOTES: Inspiratory flow measured at 50 L/Min. Patient is able to do DPI inhaler effectively.

## 2018-04-20 NOTE — Progress Notes (Signed)
Patient ready for discharge, meds given, VSS. Patient states he has medication [place]) figured out and will get a prescription from PCP.

## 2018-04-24 NOTE — Discharge Summary (Signed)
Discharge Summary  Harald Quevedo DUK:[id number] DOB: 1966/06/02  PCP: Maude Leriche, PA-C  Admit date: 04/18/2018 Discharge date: 04/20/2018   Time spent: < 25 minutes  Admitted From: home Disposition:  home  Recommendations for Outpatient Follow-up:  1. Follow up with PCP in 1 week 2.     Discharge Diagnoses:  Active Hospital Problems   Diagnosis Date Noted  . Acute respiratory failure with hypoxia ([place]) 04/19/2018  . Essential hypertension 04/19/2018  . Asthma exacerbation 04/19/2018    Resolved Hospital Problems  No resolved problems to display.    Discharge Condition: Stable   CODE STATUS:FULL    History of present illness:  Charles Ruiz is a 52 y.o. year old male with medical history significant for recently diagnosed asthma ( 08/2017) who presented on 04/18/2018 with worsening wheezing and shortness of breath and was found to have asthma exacerbation.  Symptoms started on Saturday with some problems breathing and he noticed himself wheezing. Unfortunately he had ran out of his prescription for Breo the day prior and when he went to pharmacy to obtain the price was too expensive for him to get ( of note he has been using this medication for a few months and states not having a copay).  His symptoms did not improve despite using his nebulizer machine up to 4-5 times at home so he drove himself to the ED.  Denies any history of blood clots, family history of blood clots, fevers, chills, or cough.  Remaining hospital course addressed in problem based format below:   Hospital Course:  Acute hypoxic respiratory failure secondary to asthma exacerbation.  Required 24 hours of nebulized breathing treatments and was able to wean from supplemental oxygen to room air.  Same day with an bleeding in halls without any problems.  Seems much of his favorite given by poor access medication which was resolved to talk with his primary doctor prior to discharge from hospital.  Sinus  tachycardia, resolved.  Secondary to breathing treatments.  No concerning EKG findings.  Hypokalemia.  Likely related to patient's home lisinopril and HCTZ combo.  Repleted orally.   Consultations:  none  Procedures/Studies: none  Discharge Exam: BP (!) 128/92 (BP Location: Right Arm)   Pulse 74   Temp 98.3 F (36.8 C) (Oral)   Resp 18   Ht 5\' 11"  (1.803 m) Comment: Simultaneous filing. User may not have seen previous data.  Wt 75.8 kg Comment: scale a  SpO2 97%   BMI 23.31 kg/m   General: Lying in bed, no apparent distress Eyes: EOMI, anicteric ENT: Oral Mucosa clear and moist Cardiovascular: regular rate and rhythm, no murmurs, rubs or gallops, no edema, Respiratory: Normal respiratory effort on room air, lungs clear to auscultation bilaterally Abdomen: soft, non-distended, non-tender, normal bowel sounds Skin: No Rash Neurologic: Grossly no focal neuro deficit.Mental status AAOx3, speech normal, Psychiatric:Appropriate affect, and mood   Discharge Instructions You were cared for by a hospitalist during your hospital stay. If you have any questions about your discharge medications or the care you received while you were in the hospital after you are discharged, you can call the unit and asked to speak with the hospitalist on call if the hospitalist that took care of you is not available. Once you are discharged, your primary care physician will handle any further medical issues. Please note that NO REFILLS for any discharge medications will be authorized once you are discharged, as it is imperative that you return to your primary care physician (or  establish a relationship with a primary care physician if you do not have one) for your aftercare needs so that they can reassess your need for medications and monitor your lab values.  Discharge Instructions    Diet - low sodium heart healthy   Complete by:  As directed    Increase activity slowly   Complete by:  As directed        Allergies as of 04/20/2018   No Known Allergies     Medication List    STOP taking these medications   lisinopril-hydrochlorothiazide 10-12.5 MG tablet Commonly known as:  PRINZIDE,ZESTORETIC   mometasone 0.1 % ointment Commonly known as:  ELOCON     TAKE these medications   albuterol 108 (90 Base) MCG/ACT inhaler Commonly known as:  PROVENTIL HFA;VENTOLIN HFA Inhale 2 puffs into the lungs every 4 (four) hours as needed for wheezing or shortness of breath. What changed:  Another medication with the same name was removed. Continue taking this medication, and follow the directions you see here.   amLODipine 5 MG tablet Commonly known as:  NORVASC Take 5 mg by mouth daily.   hydrochlorothiazide 25 MG tablet Commonly known as:  HYDRODIURIL Take 25 mg by mouth daily.   predniSONE 20 MG tablet Commonly known as:  DELTASONE Take 3 tablets (60 mg total) by mouth daily.      No Known Allergies Follow-up Information    Scifres, Durel Salts On 04/30/2018.   Specialty:  Physician Assistant Why:  @11 :45am Contact information: [place] [id number] [phone]            The results of significant diagnostics from this hospitalization (including imaging, microbiology, ancillary and laboratory) are listed below for reference.    Significant Diagnostic Studies: Dg Chest Port 1 View  Result Date: 04/18/2018 CLINICAL DATA:  Shortness of breath, cough, wheezing EXAM: PORTABLE CHEST 1 VIEW COMPARISON:  07/29/2017 FINDINGS: Heart and mediastinal contours are within normal limits. No focal opacities or effusions. No acute bony abnormality. IMPRESSION: No active disease. Electronically Signed   By: Rolm Baptise M.D.   On: 04/18/2018 21:54    Microbiology: No results found for this or any previous visit (from the past 240 hour(s)).   Labs: Basic Metabolic Panel: Recent Labs  Lab 04/18/18 2158 04/19/18 0318  NA 137 139  K 2.8* 3.2*  CL 107  103  CO2 22 17*  GLUCOSE 117* 218*  BUN 13 12  CREATININE 1.17 1.42*  CALCIUM 9.0 9.4  MG  --  2.4   Liver Function Tests: No results for input(s): AST, ALT, ALKPHOS, BILITOT, PROT, ALBUMIN in the last 168 hours. No results for input(s): LIPASE, AMYLASE in the last 168 hours. No results for input(s): AMMONIA in the last 168 hours. CBC: Recent Labs  Lab 04/18/18 2158 04/19/18 0318  WBC 7.7 8.9  NEUTROABS 5.4  --   HGB 13.8 13.7  HCT 44.0 43.1  MCV 84.9 84.8  PLT 220 208   Cardiac Enzymes: No results for input(s): CKTOTAL, CKMB, CKMBINDEX, TROPONINI in the last 168 hours. BNP: BNP (last 3 results) No results for input(s): BNP in the last 8760 hours.  ProBNP (last 3 results) No results for input(s): PROBNP in the last 8760 hours.  CBG: No results for input(s): GLUCAP in the last 168 hours.     Signed:  Desiree Hane, MD [facility] 04/24/2018, 10:56 PM

## 2018-04-30 DIAGNOSIS — J4541 Moderate persistent asthma with (acute) exacerbation: Secondary | ICD-10-CM | POA: Diagnosis not present

## 2018-04-30 DIAGNOSIS — E876 Hypokalemia: Secondary | ICD-10-CM | POA: Diagnosis not present

## 2018-05-14 DIAGNOSIS — E876 Hypokalemia: Secondary | ICD-10-CM | POA: Diagnosis not present

## 2018-05-14 DIAGNOSIS — E785 Hyperlipidemia, unspecified: Secondary | ICD-10-CM | POA: Diagnosis not present

## 2018-05-14 DIAGNOSIS — I10 Essential (primary) hypertension: Secondary | ICD-10-CM | POA: Diagnosis not present

## 2018-05-14 DIAGNOSIS — J4541 Moderate persistent asthma with (acute) exacerbation: Secondary | ICD-10-CM | POA: Diagnosis not present

## 2018-09-27 DIAGNOSIS — H00014 Hordeolum externum left upper eyelid: Secondary | ICD-10-CM | POA: Diagnosis not present

## 2018-09-27 DIAGNOSIS — H00011 Hordeolum externum right upper eyelid: Secondary | ICD-10-CM | POA: Diagnosis not present

## 2018-11-26 IMAGING — DX DG CHEST 2V
2 series · 2 of 2 positions shown · non-contrast
Comparison: None.

CLINICAL DATA: Shortness of breath.

EXAM:
CHEST  2 VIEW

[chest pa]
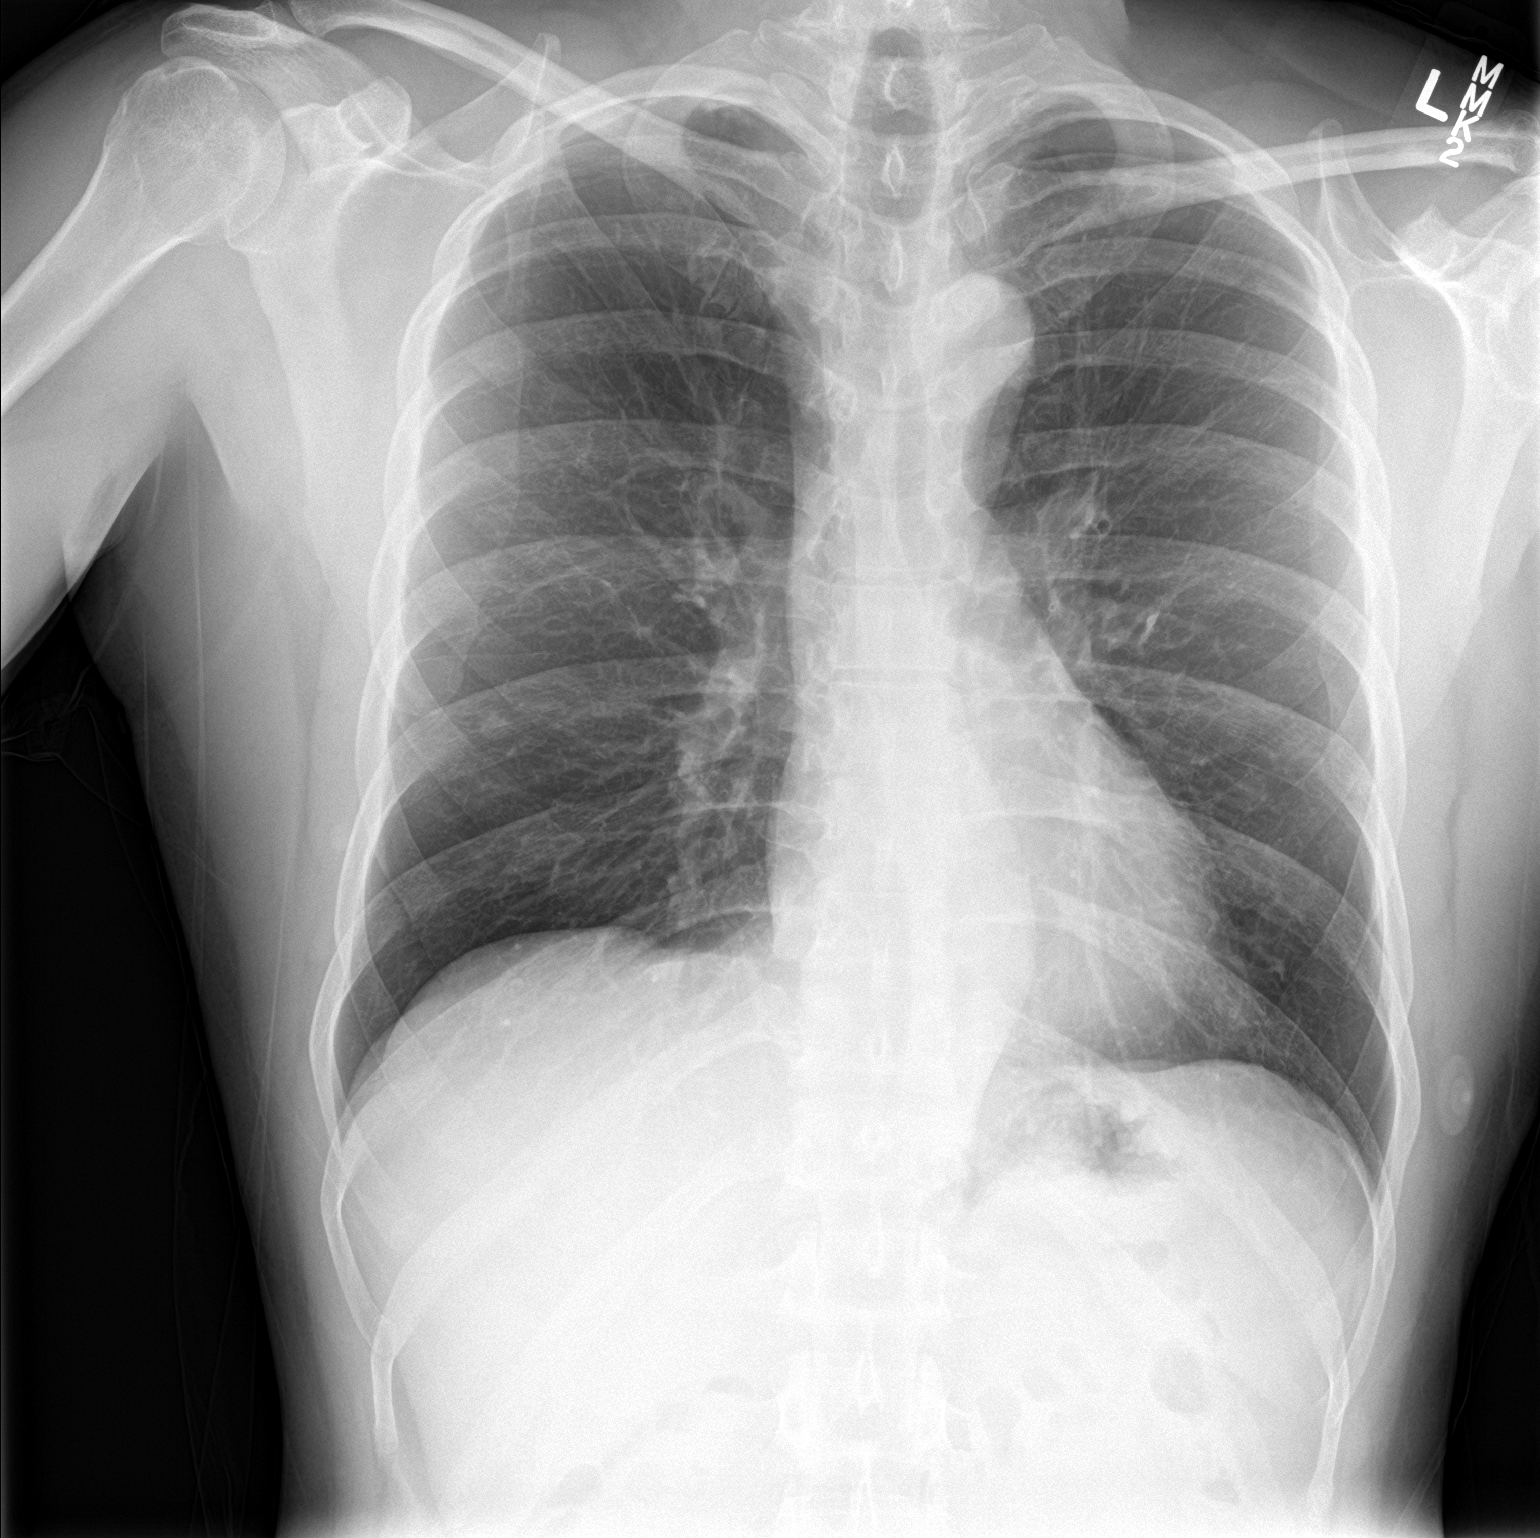

[chest lat]
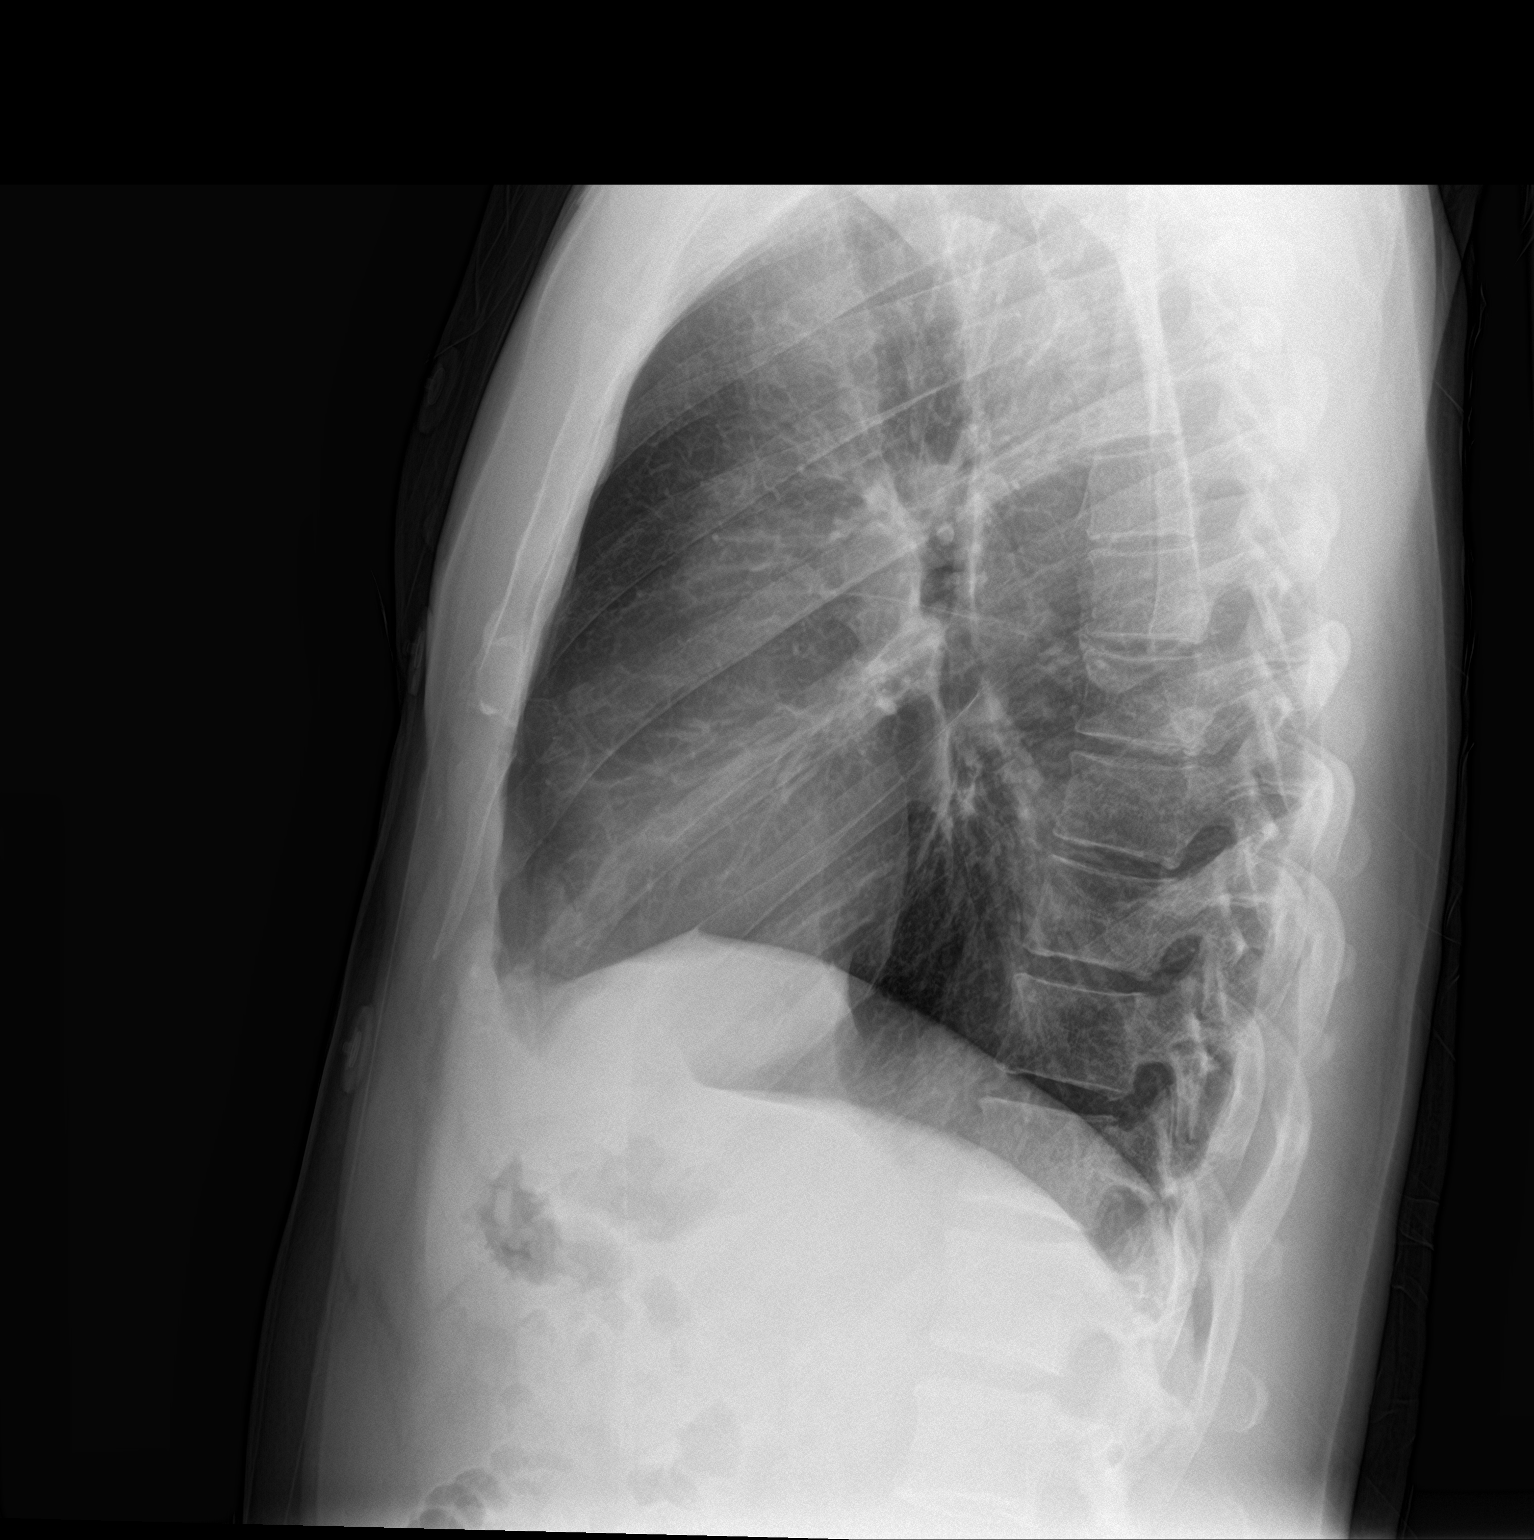

[2 of 2 positions shown; findings below may reference images not displayed]

FINDINGS: A nodules projected over the right chest measuring 8 mm. No other
nodules. No masses. No pneumothorax. The cardiomediastinal
silhouette is normal. No other acute abnormalities.
IMPRESSION: An 8 mm nodule projects over the right lower chest. This may
represent a nipple shadow. Recommend repeat imaging with nipple
markers. No acute abnormalities.

## 2018-12-22 IMAGING — CR DG CHEST 1V
1 series · 1 of 1 positions shown · non-contrast
Comparison: 07/03/2017

CLINICAL DATA: Right lung nodule seen on previous chest x-ray.

EXAM:
CHEST 1 VIEW

[w chest pa]
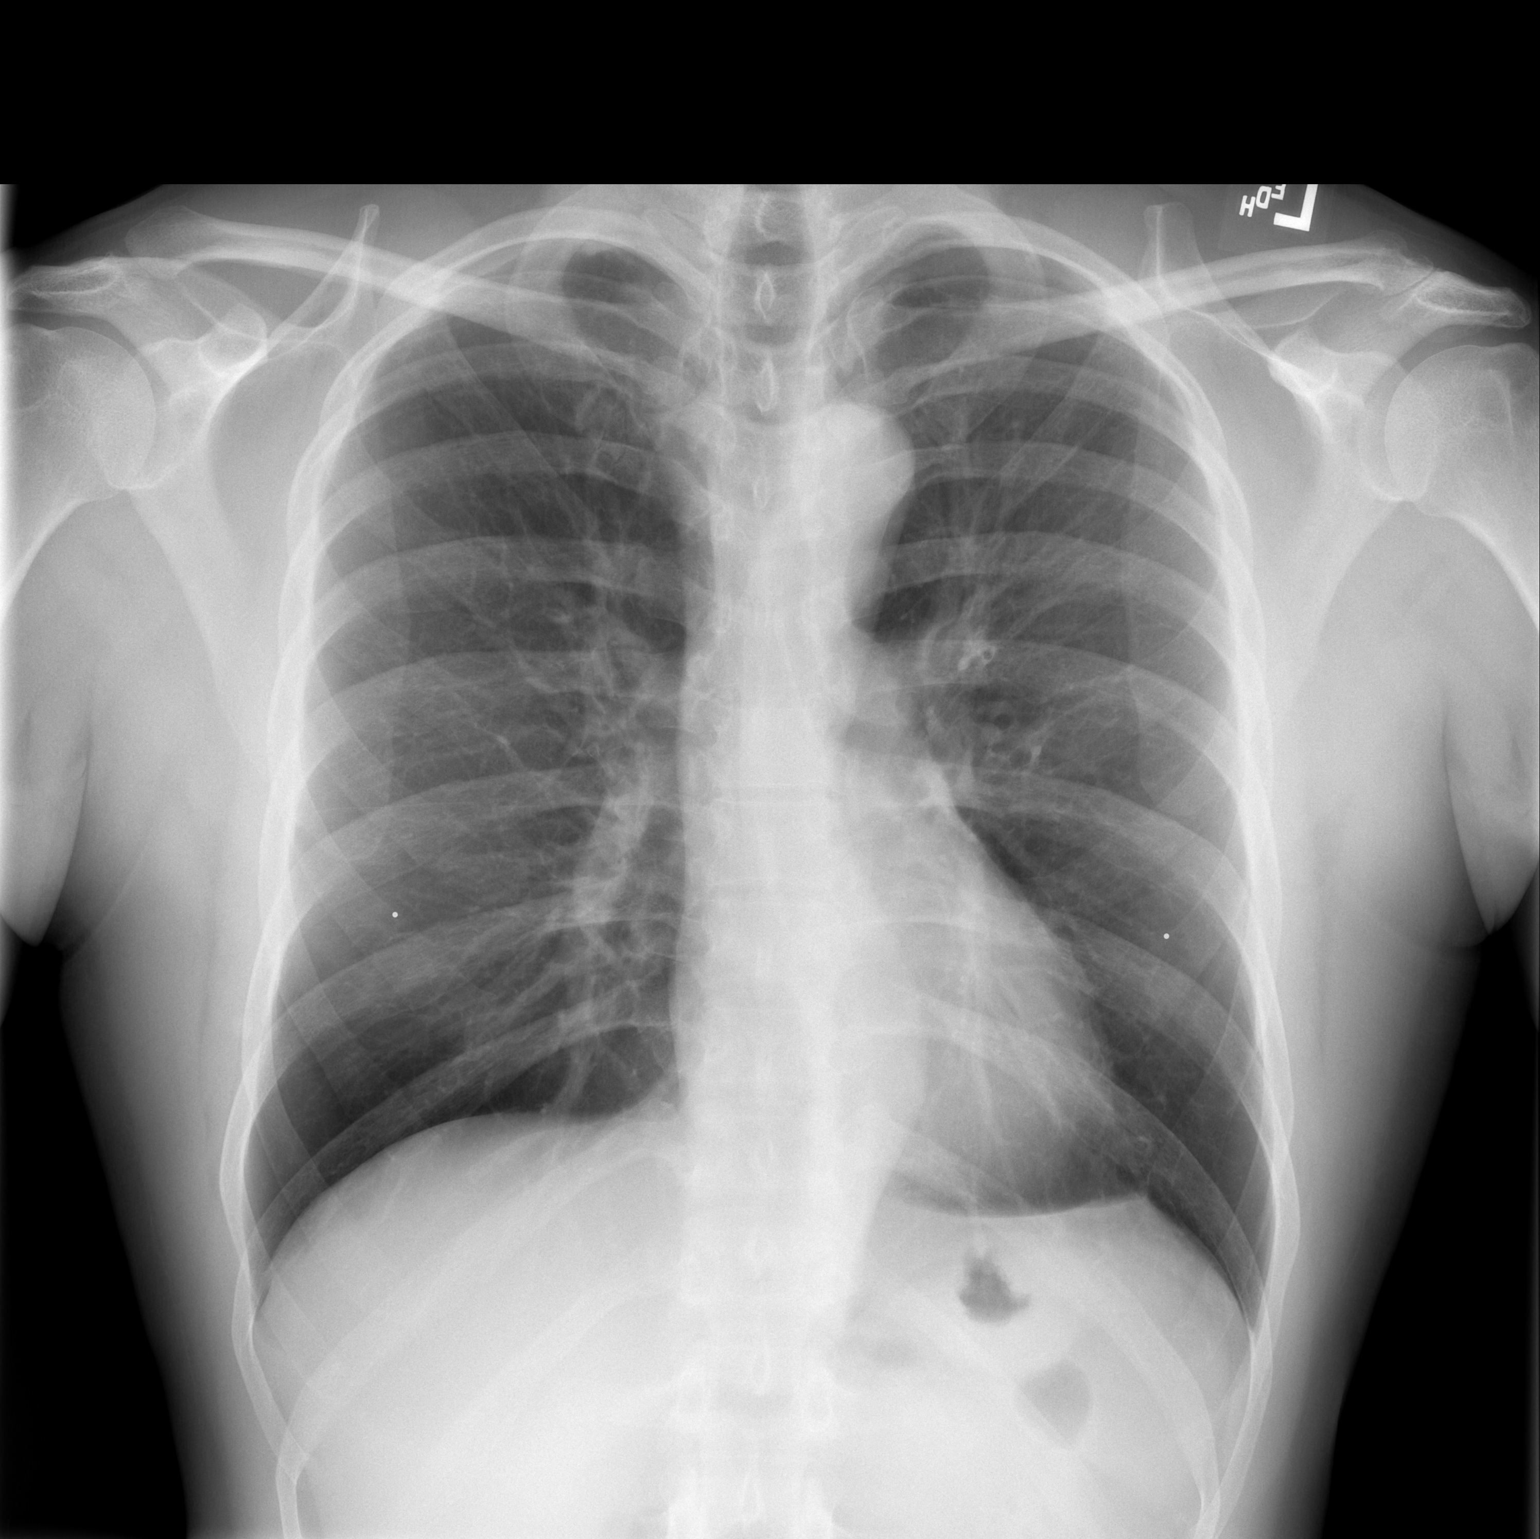

[1 of 1 positions shown; findings below may reference images not displayed]

FINDINGS: Nipple marker on today's study overlies the nodular opacity seen on
the previous exam. The lungs are clear without focal pneumonia,
edema, pneumothorax or pleural effusion. The cardiopericardial
silhouette is within normal limits for size. The visualized bony
structures of the thorax are intact.
IMPRESSION: Nodular density seen over the right lung base on the prior study
represented a nipple shadow.

## 2019-02-18 DIAGNOSIS — I10 Essential (primary) hypertension: Secondary | ICD-10-CM | POA: Diagnosis not present

## 2019-02-18 DIAGNOSIS — E785 Hyperlipidemia, unspecified: Secondary | ICD-10-CM | POA: Diagnosis not present

## 2019-02-18 DIAGNOSIS — J4541 Moderate persistent asthma with (acute) exacerbation: Secondary | ICD-10-CM | POA: Diagnosis not present

## 2019-03-04 DIAGNOSIS — E785 Hyperlipidemia, unspecified: Secondary | ICD-10-CM | POA: Diagnosis not present

## 2019-03-04 DIAGNOSIS — I10 Essential (primary) hypertension: Secondary | ICD-10-CM | POA: Diagnosis not present

## 2019-04-01 DIAGNOSIS — D649 Anemia, unspecified: Secondary | ICD-10-CM | POA: Diagnosis not present

## 2019-06-17 DIAGNOSIS — I10 Essential (primary) hypertension: Secondary | ICD-10-CM | POA: Diagnosis not present

## 2019-06-17 DIAGNOSIS — M109 Gout, unspecified: Secondary | ICD-10-CM | POA: Diagnosis not present

## 2019-08-03 ENCOUNTER — Other Ambulatory Visit: Payer: Self-pay

## 2019-08-03 ENCOUNTER — Encounter (HOSPITAL_COMMUNITY): Payer: Self-pay | Admitting: Emergency Medicine

## 2019-08-03 ENCOUNTER — Ambulatory Visit (HOSPITAL_COMMUNITY)
Admission: EM | Admit: 2019-08-03 | Discharge: 2019-08-03 | Disposition: A | Discharge status: Home / Self Care | Payer: BC Managed Care – PPO | Attending: Family Medicine | Admitting: Family Medicine

## 2019-08-03 DIAGNOSIS — R5383 Other fatigue: Secondary | ICD-10-CM | POA: Insufficient documentation

## 2019-08-03 DIAGNOSIS — J45909 Unspecified asthma, uncomplicated: Secondary | ICD-10-CM | POA: Diagnosis not present

## 2019-08-03 DIAGNOSIS — R05 Cough: Secondary | ICD-10-CM

## 2019-08-03 DIAGNOSIS — Z20822 Contact with and (suspected) exposure to covid-19: Secondary | ICD-10-CM | POA: Insufficient documentation

## 2019-08-03 DIAGNOSIS — I10 Essential (primary) hypertension: Secondary | ICD-10-CM | POA: Diagnosis not present

## 2019-08-03 DIAGNOSIS — Z89021 Acquired absence of right finger(s): Secondary | ICD-10-CM | POA: Diagnosis not present

## 2019-08-03 DIAGNOSIS — R059 Cough, unspecified: Secondary | ICD-10-CM

## 2019-08-03 DIAGNOSIS — Z87891 Personal history of nicotine dependence: Secondary | ICD-10-CM | POA: Insufficient documentation

## 2019-08-03 DIAGNOSIS — Z8249 Family history of ischemic heart disease and other diseases of the circulatory system: Secondary | ICD-10-CM | POA: Diagnosis not present

## 2019-08-03 MED ORDER — BENZONATATE 100 MG PO CAPS: ORAL_CAPSULE | ORAL | 0 refills | Status: DC

## 2019-08-03 NOTE — ED Provider Notes (Signed)
[facility]   [id number] 08/03/19 Arrival Time: [id number]  ASSESSMENT & PLAN:  1. Cough   2. Fatigue, unspecified type     COVID-19 testing sent. To self-quarantine until results are available. If requested, work note provided.  Follow-up Information    Scifres, Earlie Server, [place].   Specialty: Physician Assistant Why: As needed. Contact information: [place] [address] [phone]           Reviewed expectations re: course of current medical issues. Questions answered. Outlined signs and symptoms indicating need for more acute intervention. Patient verbalized understanding. After Visit Summary given.   SUBJECTIVE: History from: patient. Charles Ruiz is a 54 y.o. male who requests COVID-19 testing. Known COVID-19 contact: none. Recent travel: none. Denies: runny nose, congestion, fever, sore throat, difficulty breathing and headache.Does reports a dry cough and fatigue over the past 4 days. Normal PO intake without n/v/d.  ROS: As per HPI.   OBJECTIVE:  Vitals:   08/03/19 1740  BP: 128/86  Pulse: 96  Resp: 16  Temp: 99.1 F (37.3 C)  TempSrc: Oral  SpO2: 98%    General appearance: alert; no distress Eyes: PERRLA; EOMI; conjunctiva normal HENT: [place]; AT; nasal mucosa normal; oral mucosa normal Neck: supple  Lungs: speaks full sentences without difficulty; unlabored Extremities: no edema Skin: warm and dry Psychological: alert and cooperative; normal mood and affect  Labs: Labs Reviewed  NOVEL CORONAVIRUS, NAA (HOSP ORDER, SEND-OUT TO REF LAB; TAT 18-24 HRS)    No Known Allergies  Past Medical History:  Diagnosis Date  . Arthritis    "LUE dx'd no problem w/it" (04/19/2018)  . Asthma   . High cholesterol   . Hypertension   . Seizures ([place]) 2004 X 1; 2019 X 1   "don't know why for sure"   Social History   Socioeconomic History  . Marital status: Married    Spouse name: Not on file  . Number of children: Not on  file  . Years of education: Not on file  . Highest education level: Not on file  Occupational History  . Not on file  Tobacco Use  . Smoking status: Former Smoker    Packs/day: 1.50    Years: 34.00    Pack years: 51.00    Types: Cigarettes    Quit date: 2016    Years since quitting: 5.0  . Smokeless tobacco: Never Used  Substance and Sexual Activity  . Alcohol use: Yes    Comment: 04/19/2018 "vodka; q other month; maybe 6 pack of beer/yr"  . Drug use: Never  . Sexual activity: Not on file  Other Topics Concern  . Not on file  Social History Narrative  . Not on file   Social Determinants of Health   Financial Resource Strain:   . Difficulty of Paying Living Expenses: Not on file  Food Insecurity:   . Worried About Charity fundraiser in the Last Year: Not on file  . Ran Out of Food in the Last Year: Not on file  Transportation Needs:   . Lack of Transportation (Medical): Not on file  . Lack of Transportation (Non-Medical): Not on file  Physical Activity:   . Days of Exercise per Week: Not on file  . Minutes of Exercise per Session: Not on file  Stress:   . Feeling of Stress : Not on file  Social Connections:   . Frequency of Communication with Friends and Family: Not on file  . Frequency  of Social Gatherings with Friends and Family: Not on file  . Attends Religious Services: Not on file  . Active Member of Clubs or Organizations: Not on file  . Attends Archivist Meetings: Not on file  . Marital Status: Not on file  Intimate Partner Violence:   . Fear of Current or Ex-Partner: Not on file  . Emotionally Abused: Not on file  . Physically Abused: Not on file  . Sexually Abused: Not on file   Family History  Problem Relation Age of Onset  . Hypertension Mother   . Hypertension Sister   . Hypertension Brother    Past Surgical History:  Procedure Laterality Date  . BIOPSY THYROID  ~ 2015  . FINGER AMPUTATION Right 1990s   "pinky"     Vanessa Kick,  MD 08/03/19 1800

## 2019-08-03 NOTE — ED Triage Notes (Signed)
Cough and fatigue that started Saturday.

## 2019-08-03 NOTE — Discharge Instructions (Addendum)
You have been tested for COVID-19 today. If your test is positive, you will receive a phone call from [facility] regarding your results. Negative test results are not called. Both positive and negative results area always visible on MyChart. If you do not have a MyChart account, sign up instructions are in your discharge papers.

## 2019-08-05 LAB — NOVEL CORONAVIRUS, NAA (HOSP ORDER, SEND-OUT TO REF LAB; TAT 18-24 HRS): SARS-CoV-2, NAA: NOT DETECTED

## 2019-09-11 IMAGING — DX DG CHEST 1V PORT
1 series · 1 of 1 positions shown · non-contrast
Comparison: 07/29/2017

CLINICAL DATA: Shortness of breath, cough, wheezing

EXAM:
PORTABLE CHEST 1 VIEW

[chest]
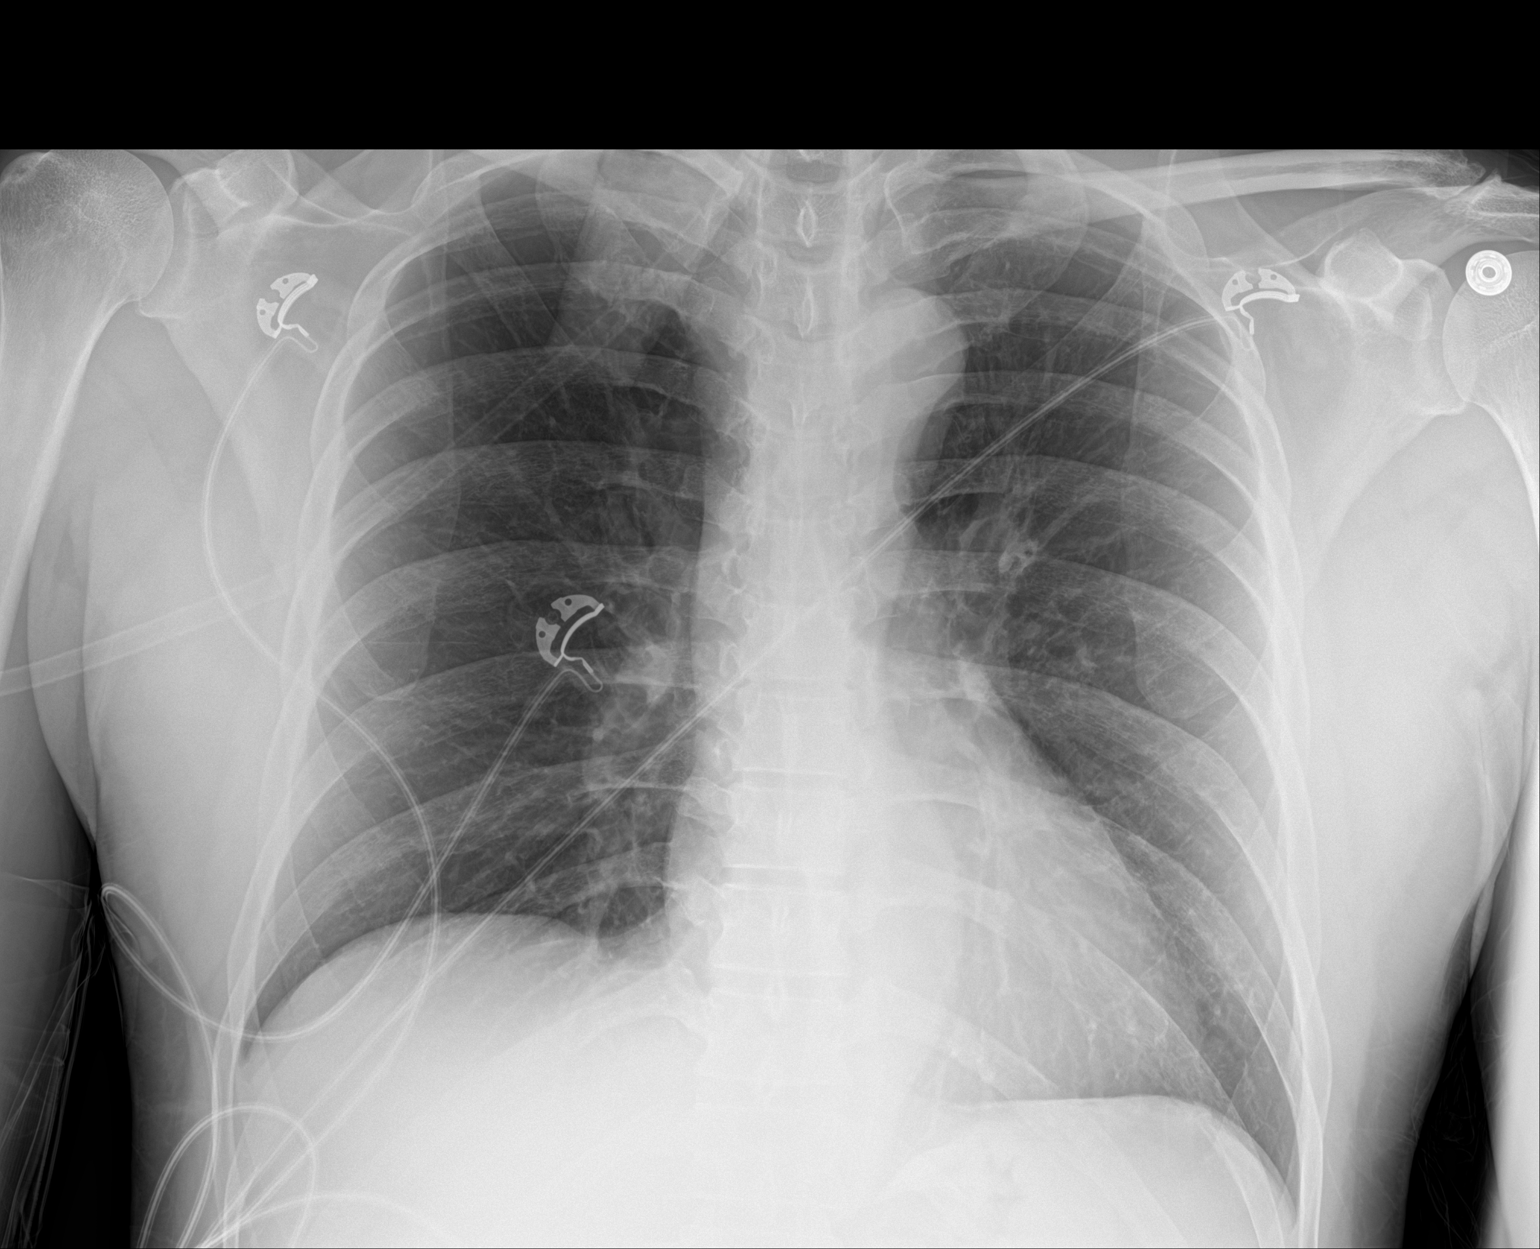

[1 of 1 positions shown; findings below may reference images not displayed]

FINDINGS: Heart and mediastinal contours are within normal limits. No focal
opacities or effusions. No acute bony abnormality.
IMPRESSION: No active disease.

## 2019-11-07 ENCOUNTER — Other Ambulatory Visit: Payer: BC Managed Care – PPO

## 2019-11-08 ENCOUNTER — Ambulatory Visit (INDEPENDENT_AMBULATORY_CARE_PROVIDER_SITE_OTHER)
Admission: EM | Admit: 2019-11-08 | Discharge: 2019-11-08 | Disposition: A | Discharge status: Home / Self Care | Payer: BC Managed Care – PPO | Source: Home / Self Care

## 2019-11-08 ENCOUNTER — Emergency Department (HOSPITAL_COMMUNITY): Payer: BC Managed Care – PPO

## 2019-11-08 ENCOUNTER — Encounter (HOSPITAL_COMMUNITY): Payer: Self-pay

## 2019-11-08 ENCOUNTER — Other Ambulatory Visit: Payer: Self-pay

## 2019-11-08 ENCOUNTER — Encounter (HOSPITAL_COMMUNITY): Payer: Self-pay | Admitting: Emergency Medicine

## 2019-11-08 ENCOUNTER — Emergency Department (HOSPITAL_COMMUNITY)
Admission: EM | Admit: 2019-11-08 | Discharge: 2019-11-09 | Disposition: A | Discharge status: Home / Self Care | Payer: BC Managed Care – PPO | Attending: Emergency Medicine | Admitting: Emergency Medicine

## 2019-11-08 DIAGNOSIS — H53149 Visual discomfort, unspecified: Secondary | ICD-10-CM | POA: Insufficient documentation

## 2019-11-08 DIAGNOSIS — M7989 Other specified soft tissue disorders: Secondary | ICD-10-CM | POA: Diagnosis not present

## 2019-11-08 DIAGNOSIS — Z79899 Other long term (current) drug therapy: Secondary | ICD-10-CM | POA: Diagnosis not present

## 2019-11-08 DIAGNOSIS — R519 Headache, unspecified: Secondary | ICD-10-CM

## 2019-11-08 DIAGNOSIS — R112 Nausea with vomiting, unspecified: Secondary | ICD-10-CM | POA: Diagnosis not present

## 2019-11-08 DIAGNOSIS — J45909 Unspecified asthma, uncomplicated: Secondary | ICD-10-CM | POA: Insufficient documentation

## 2019-11-08 DIAGNOSIS — R2243 Localized swelling, mass and lump, lower limb, bilateral: Secondary | ICD-10-CM | POA: Diagnosis not present

## 2019-11-08 DIAGNOSIS — R197 Diarrhea, unspecified: Secondary | ICD-10-CM | POA: Diagnosis not present

## 2019-11-08 DIAGNOSIS — I10 Essential (primary) hypertension: Secondary | ICD-10-CM | POA: Insufficient documentation

## 2019-11-08 DIAGNOSIS — R7989 Other specified abnormal findings of blood chemistry: Secondary | ICD-10-CM | POA: Diagnosis not present

## 2019-11-08 DIAGNOSIS — H538 Other visual disturbances: Secondary | ICD-10-CM | POA: Insufficient documentation

## 2019-11-08 DIAGNOSIS — Z87891 Personal history of nicotine dependence: Secondary | ICD-10-CM | POA: Diagnosis not present

## 2019-11-08 MED ORDER — SODIUM CHLORIDE 0.9 % IV BOLUS (SEPSIS)
1000.0000 mL | Freq: Once | INTRAVENOUS | Status: AC
  Administered 2019-11-09: 1000 mL via INTRAVENOUS

## 2019-11-08 MED ORDER — KETOROLAC TROMETHAMINE 30 MG/ML IJ SOLN
30.0000 mg | Freq: Once | INTRAMUSCULAR | Status: AC
  Administered 2019-11-09: 30 mg via INTRAVENOUS
  Filled 2019-11-08: qty 1

## 2019-11-08 MED ORDER — ONDANSETRON HCL 4 MG/2ML IJ SOLN
4.0000 mg | Freq: Once | INTRAMUSCULAR | Status: AC
  Administered 2019-11-09: 4 mg via INTRAVENOUS
  Filled 2019-11-08: qty 2

## 2019-11-08 NOTE — ED Triage Notes (Signed)
Pt states today at 12pm he developed a 10/10 headache states he took a BC powder which eased pain mildly but now states "its a brain buster, worst headache he's ever had". Pt is ambulatory no neuro sxs. Pt is alert and ox4. Pt did take johnson [employer] johnson vaccine at [facility] and also having body aches and chills as well as swelling in his left leg that just started.

## 2019-11-08 NOTE — ED Notes (Signed)
Spoke with MD Pffeifer regarding this pt and verbal order for Ct of head w.o contrast

## 2019-11-08 NOTE — ED Provider Notes (Signed)
TIME SEEN: 11:47 PM  CHIEF COMPLAINT: "I feel sick"  HPI: Patient is a 54 year old male with history of hypertension, hyperlipidemia, asthma, seizures on antiepileptics who presents to the emergency department complaining of feeling sick.  He complains of diffuse throbbing headache, photophobia, nausea, vomiting, diarrhea, chills, body aches, leg swelling bilaterally.  He denies any chest pain, shortness of breath, cough.  He did receive his [place] vaccination this morning before feeling poorly.  Was sent here from urgent care for evaluation.  Headache improved with Goody powder.  Has photophobia.  No numbness or weakness.  ROS: See HPI Constitutional: no fever  Eyes: no drainage  ENT: no runny nose   Cardiovascular:  no chest pain  Resp: no SOB  GI:  vomiting and diarrhea GU: no dysuria Integumentary: no rash  Allergy: no hives  Musculoskeletal:  leg swelling  Neurological: no slurred speech ROS otherwise negative  PAST MEDICAL HISTORY/PAST SURGICAL HISTORY:  Past Medical History:  Diagnosis Date  . Arthritis    "LUE dx'd no problem w/it" (04/19/2018)  . Asthma   . High cholesterol   . Hypertension   . Seizures ([place]) 2004 X 1; 2019 X 1   "don't know why for sure"    MEDICATIONS:  Prior to Admission medications   Medication Sig Start Date End Date Taking? Authorizing Provider  albuterol (PROVENTIL HFA;VENTOLIN HFA) 108 (90 Base) MCG/ACT inhaler Inhale 2 puffs into the lungs every 4 (four) hours as needed for wheezing or shortness of breath. 12/11/16   Wynona Luna, MD  amLODipine (NORVASC) 10 MG tablet Take 10 mg by mouth daily. 11/01/19   [provider]  amLODipine (NORVASC) 5 MG tablet Take 5 mg by mouth daily. 04/02/18   [provider]  atorvastatin (LIPITOR) 20 MG tablet Take 20 mg by mouth daily. 10/28/19   [provider]  benzonatate (TESSALON) 100 MG capsule Take 1 capsule by mouth every 8 (eight) hours for cough. 08/03/19    Vanessa Kick, MD  DULERA 200-5 MCG/ACT AERO Inhale 2 puffs into the lungs 2 (two) times daily. 08/21/19   [provider]  hydrochlorothiazide (HYDRODIURIL) 25 MG tablet Take 25 mg by mouth daily. 02/12/18   [provider]  indomethacin (INDOCIN) 50 MG capsule Take 50 mg by mouth 2 (two) times daily as needed. 06/17/19   [provider]  predniSONE (DELTASONE) 10 MG tablet TAKE BY MOUTH 4 4 4 4 2 2 2 2 1 1 1 1  ONCE DAILY FOR 12 DAYS. 06/17/19   [provider]  predniSONE (DELTASONE) 20 MG tablet Take 3 tablets (60 mg total) by mouth daily. 07/04/17   Theodora Lalanne, Delice Bison, DO    ALLERGIES:  No Known Allergies  SOCIAL HISTORY:  Social History   Tobacco Use  . Smoking status: Former Smoker    Packs/day: 1.50    Years: 34.00    Pack years: 51.00    Types: Cigarettes    Quit date: 2016    Years since quitting: 5.2  . Smokeless tobacco: Never Used  Substance Use Topics  . Alcohol use: Yes    Comment: 04/19/2018 "vodka; q other month; maybe 6 pack of beer/yr"    FAMILY HISTORY: Family History  Problem Relation Age of Onset  . Hypertension Mother   . Hypertension Sister   . Hypertension Brother     EXAM: BP (!) 152/92 (BP Location: Right Arm)   Pulse 96   Temp 98.4 F (36.9 C) (Oral)  Resp 16   SpO2 99%  CONSTITUTIONAL: Alert and oriented and responds appropriately to questions. Well-appearing; well-nourished, nontoxic, well-hydrated HEAD: Normocephalic, atraumatic EYES: Conjunctivae clear, pupils appear equal, EOM appear intact ENT: normal nose; moist mucous membranes NECK: Supple, normal ROM, no meningismus CARD: RRR; S1 and S2 appreciated; no murmurs, no clicks, no rubs, no gallops RESP: Normal chest excursion without splinting or tachypnea; breath sounds clear and equal bilaterally; no wheezes, no rhonchi, no rales, no hypoxia or respiratory distress, speaking full sentences ABD/GI: Normal bowel sounds; non-distended; soft, non-tender, no  rebound, no guarding, no peritoneal signs, no hepatosplenomegaly BACK:  The back appears normal and is nontender to palpation EXT: Normal ROM in all joints; no deformity noted, no edema; no cyanosis SKIN: Normal color for age and race; warm; no rash on exposed skin NEURO: Moves all extremities equally, cranial nerves II through XII intact, sensation to light touch intact diffusely, normal speech PSYCH: The patient's mood and manner are appropriate.   MEDICAL DECISION MAKING: Patient here with flulike symptoms after receiving [place] vaccination.  Complains of severe headache that has improved with Goody powders.  Still having residual headache.  Neurologically intact here.  Afebrile and nontoxic.  Doubt meningitis, encephalitis, stroke.  Low suspicion at this time for intracranial hemorrhage.  He has multiple flulike symptoms that we have discussed are typical after receiving a Covid vaccination.  He seems to be concerned about recent news that [employer] vaccines have been halted in the [place] secondary to rare occurrence of TTP.  Reassured him that we will check blood work today.  We will treat symptoms with Toradol, Zofran and IV fluids.  ED PROGRESS: Labs reviewed/interpreted.  Normal white blood cell count, hemoglobin and platelet count.  Creatinine and electrolytes normal.  AST is elevated at 934 and ALT 451.  Otherwise LFTs within normal limits.  Will obtain right upper quadrant ultrasound, lipase, Tylenol and alcohol levels, hepatitis panel.  Patient denies any current upper abdominal pain.  He states he has not been taking Tylenol.  He does drink 2-3 beers 3 times a week.  No history of hepatitis.  Reports his headache and nausea have significantly improved.   4:18 AM  Pt's Tylenol, alcohol levels are normal.  Lipase normal.  Right upper quadrant ultrasound shows no acute abnormality.  Will discharge.  We will have him follow-up with his PCP in 1 to 2 weeks to have  liver enzymes rechecked.  Again he is not having any abdominal pain.  Able to tolerate p.o.  Have advised him to avoid Tylenol, alcohol at this time.   At this time, I do not feel there is any life-threatening condition present. I have reviewed, interpreted and discussed all results (EKG, imaging, lab, urine as appropriate) and exam findings with patient/family. I have reviewed nursing notes and appropriate previous records.  I feel the patient is safe to be discharged home without further emergent workup and can continue workup as an outpatient as needed. Discussed usual and customary return precautions. Patient/family verbalize understanding and are comfortable with this plan.  Outpatient follow-up has been provided as needed. All questions have been answered.    Shreyan Nabong was evaluated in Emergency Department on 11/08/2019 for the symptoms described in the history of present illness. He was evaluated in the context of the global COVID-19 pandemic, which necessitated consideration that the patient might be at risk for infection with the SARS-CoV-2 virus that causes COVID-19. Institutional protocols and algorithms that pertain  to the evaluation of patients at risk for COVID-19 are in a state of rapid change based on information released by regulatory bodies including the CDC and federal and state organizations. These policies and algorithms were followed during the patient's care in the ED.      Raevyn Sokol, Delice Bison, DO 11/09/19 [phone]

## 2019-11-08 NOTE — Discharge Instructions (Addendum)
Please report to the emergency department for further evaluation of your severe headache and possible reaction to COVID vaccine this AM.

## 2019-11-08 NOTE — ED Provider Notes (Signed)
[place]    CSN: [id number] Arrival date & time: 11/08/19  1558      History   Chief Complaint Chief Complaint  Patient presents with  . Diarrhea  . Headache    HPI Charles Ruiz is a 54 y.o. male.   Patient reports to urgent care for evaluation of severe headache, diarrhea and lower extremity swelling.  He reports his headache started around 11:00 this afternoon and has been severe since then.  He describes it as a lancinating shooting pain across the top of his head.  It is a 10/10 in intensity.  He does not have a history of headaches.  Reports that he has photophobia with the headache.  He has had some mild blurred vision.  Denies any numbness or weakness in his extremities.  He also reports 3-4 episodes of loose stool.  He also has had lower extremity swelling all day.  He denies history of having lower extremity swelling.  He reports that he received his Covid 19 vaccine this morning at 6:30 AM.  He received the [employer] vaccine which was deemed to be stopped today in [place].  Denies shortness of breath, tongue or oral swelling.     Past Medical History:  Diagnosis Date  . Arthritis    "LUE dx'd no problem w/it" (04/19/2018)  . Asthma   . High cholesterol   . Hypertension   . Seizures ([place]) 2004 X 1; 2019 X 1   "don't know why for sure"    Patient Active Problem List   Diagnosis Date Noted  . Acute respiratory failure with hypoxia ([place]) 04/19/2018  . Essential hypertension 04/19/2018  . Asthma exacerbation 04/19/2018  . 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 Multiple thyroid nodules 03/17/2012    Past Surgical History:  Procedure Laterality Date  . BIOPSY THYROID  ~ 2015  . FINGER AMPUTATION Right 1990s   "pinky"       Home Medications    Prior to Admission medications   Medication Sig Start Date End Date Taking? Authorizing Provider  albuterol (PROVENTIL HFA;VENTOLIN HFA) 108 (90 Base) MCG/ACT inhaler Inhale 2 puffs  into the lungs every 4 (four) hours as needed for wheezing or shortness of breath. 12/11/16   Wynona Luna, MD  amLODipine (NORVASC) 10 MG tablet Take 10 mg by mouth daily. 11/01/19   [provider]  amLODipine (NORVASC) 5 MG tablet Take 5 mg by mouth daily. 04/02/18   [provider]  atorvastatin (LIPITOR) 20 MG tablet Take 20 mg by mouth daily. 10/28/19   [provider]  benzonatate (TESSALON) 100 MG capsule Take 1 capsule by mouth every 8 (eight) hours for cough. 08/03/19   Vanessa Kick, MD  DULERA 200-5 MCG/ACT AERO Inhale 2 puffs into the lungs 2 (two) times daily. 08/21/19   [provider]  hydrochlorothiazide (HYDRODIURIL) 25 MG tablet Take 25 mg by mouth daily. 02/12/18   [provider]  indomethacin (INDOCIN) 50 MG capsule Take 50 mg by mouth 2 (two) times daily as needed. 06/17/19   [provider]  predniSONE (DELTASONE) 10 MG tablet TAKE BY MOUTH 4 4 4 4 2 2 2 2 1 1 1 1  ONCE DAILY FOR 12 DAYS. 06/17/19   [provider]  predniSONE (DELTASONE) 20 MG tablet Take 3 tablets (60 mg total) by mouth daily. 07/04/17   Ward, Delice Bison, DO    Family History Family History  Problem Relation Age of Onset  .  Hypertension Mother   . Hypertension Sister   . Hypertension Brother     Social History Social History   Tobacco Use  . Smoking status: Former Smoker    Packs/day: 1.50    Years: 34.00    Pack years: 51.00    Types: Cigarettes    Quit date: 2016    Years since quitting: 5.2  . Smokeless tobacco: Never Used  Substance Use Topics  . Alcohol use: Yes    Comment: 04/19/2018 "vodka; q other month; maybe 6 pack of beer/yr"  . Drug use: Never     Allergies   Patient has no known allergies.   Review of Systems Review of Systems See HPI  Physical Exam Triage Vital Signs ED Triage Vitals [11/08/19 1647]  Enc Vitals Group     BP (!) 145/96     Pulse Rate 95     Resp 18     Temp 98.4 F (36.9 C)     Temp  Source Oral     SpO2 98 %     Weight      Height      Head Circumference      Peak Flow      Pain Score      Pain Loc      Pain Edu?      Excl. in [place]?    No data found.  Updated Vital Signs BP (!) 145/96 (BP Location: Left Arm)   Pulse 95   Temp 98.4 F (36.9 C) (Oral)   Resp 18   SpO2 98%   Visual Acuity Right Eye Distance:   Left Eye Distance:   Bilateral Distance:    Right Eye Near:   Left Eye Near:    Bilateral Near:     Physical Exam Vitals and nursing note reviewed.  Constitutional:      Appearance: He is well-developed. He is not diaphoretic.     Comments: Patient with head down on exam table.  Lights dimmed for patient comfort.  HENT:     Head: Normocephalic and atraumatic.  Eyes:     Conjunctiva/sclera: Conjunctivae normal.  Cardiovascular:     Rate and Rhythm: Normal rate and regular rhythm.     Heart sounds: No murmur.  Pulmonary:     Effort: Pulmonary effort is normal. No respiratory distress.     Breath sounds: Normal breath sounds.  Musculoskeletal:     Cervical back: Neck supple.     Comments: 1+ pitting edema to just above the ankle bilaterally.  Skin:    General: Skin is warm and dry.  Neurological:     Mental Status: He is alert and oriented to person, place, and time.     Cranial Nerves: No cranial nerve deficit, dysarthria or facial asymmetry.     Motor: No weakness.      UC Treatments / Results  Labs (all labs ordered are listed, but only abnormal results are displayed) Labs Reviewed - No data to display  EKG   Radiology No results found.  Procedures Procedures (including critical care time)  Medications Ordered in UC Medications - No data to display  Initial Impression / Assessment and Plan / UC Course  I have reviewed the triage vital signs and the nursing notes.  Pertinent labs & imaging results that were available during my care of the patient were reviewed by me and considered in my medical decision making (see  chart for details).     #Severe headache #Leg swelling  Patient is a 54 year old presenting for acute onset of severe headache.  Given he does not but history headache and received Wynetta Emery & Wynetta Emery go vaccine today I do believe he needs further monitoring and work-up at the emergency department.  Concern would be complicated headache from vaccine vs acute intracranial pathology.  Patient agrees to report to the emergency department for further evaluation and monitoring. Final Clinical Impressions(s) / UC Diagnoses   Final diagnoses:  Bad headache  Leg swelling     Discharge Instructions     Please report to the emergency department for further evaluation of your severe headache and possible reaction to COVID vaccine this AM.    ED Prescriptions    None     PDMP not reviewed this encounter.   Purnell Shoemaker, PA-C 11/08/19 1758

## 2019-11-08 NOTE — ED Triage Notes (Addendum)
Pt is here with diarrhea, migraine that started today, also he is having leg pain in both legs as well today. Pt has taken BC powders to relieve discomfort.

## 2019-11-09 ENCOUNTER — Emergency Department (HOSPITAL_COMMUNITY): Payer: BC Managed Care – PPO

## 2019-11-09 DIAGNOSIS — R112 Nausea with vomiting, unspecified: Secondary | ICD-10-CM | POA: Diagnosis not present

## 2019-11-09 DIAGNOSIS — R197 Diarrhea, unspecified: Secondary | ICD-10-CM | POA: Diagnosis not present

## 2019-11-09 DIAGNOSIS — R945 Abnormal results of liver function studies: Secondary | ICD-10-CM | POA: Diagnosis not present

## 2019-11-09 DIAGNOSIS — R519 Headache, unspecified: Secondary | ICD-10-CM | POA: Diagnosis not present

## 2019-11-09 DIAGNOSIS — R7989 Other specified abnormal findings of blood chemistry: Secondary | ICD-10-CM | POA: Diagnosis not present

## 2019-11-09 LAB — COMPREHENSIVE METABOLIC PANEL
ALT: 451 U/L — ABNORMAL HIGH (ref 0–44)
AST: 934 U/L — ABNORMAL HIGH (ref 15–41)
Albumin: 4.3 g/dL (ref 3.5–5.0)
Alkaline Phosphatase: 50 U/L (ref 38–126)
Anion gap: 10 (ref 5–15)
BUN: 22 mg/dL — ABNORMAL HIGH (ref 6–20)
CO2: 26 mmol/L (ref 22–32)
Calcium: 9.3 mg/dL (ref 8.9–10.3)
Chloride: 101 mmol/L (ref 98–111)
Creatinine, Ser: 1.23 mg/dL (ref 0.61–1.24)
GFR calc Af Amer: >60 mL/min (ref >60)
GFR calc non Af Amer: >60 mL/min (ref >60)
Glucose, Bld: 102 mg/dL — ABNORMAL HIGH (ref 70–99)
Potassium: 4.7 mmol/L (ref 3.5–5.1)
Sodium: 137 mmol/L (ref 135–145)
Total Bilirubin: 1 mg/dL (ref 0.3–1.2)
Total Protein: 7.1 g/dL (ref 6.5–8.1)

## 2019-11-09 LAB — HEPATITIS PANEL, ACUTE
HCV Ab: NONREACTIVE
Hep A IgM: NONREACTIVE
Hep B C IgM: NONREACTIVE
Hepatitis B Surface Ag: NONREACTIVE

## 2019-11-09 LAB — CBC WITH DIFFERENTIAL/PLATELET
Abs Immature Granulocytes: 0.02 10*3/uL (ref 0.00–0.07)
Basophils Absolute: 0 10*3/uL (ref 0.0–0.1)
Basophils Relative: 0 %
Eosinophils Absolute: 0 10*3/uL (ref 0.0–0.5)
Eosinophils Relative: 1 %
HCT: 45.1 % (ref 39.0–52.0)
Hemoglobin: 14.1 g/dL (ref 13.0–17.0)
Immature Granulocytes: 0 %
Lymphocytes Relative: 9 %
Lymphs Abs: 0.7 10*3/uL (ref 0.7–4.0)
MCH: 26.4 pg (ref 26.0–34.0)
MCHC: 31.3 g/dL (ref 30.0–36.0)
MCV: 84.5 fL (ref 80.0–100.0)
Monocytes Absolute: 0.4 10*3/uL (ref 0.1–1.0)
Monocytes Relative: 6 %
Neutro Abs: 6.3 10*3/uL (ref 1.7–7.7)
Neutrophils Relative %: 84 %
Platelets: 191 10*3/uL (ref 150–400)
RBC: 5.34 MIL/uL (ref 4.22–5.81)
RDW: 14.9 % (ref 11.5–15.5)
WBC: 7.5 10*3/uL (ref 4.0–10.5)
nRBC: 0 % (ref 0.0–0.2)

## 2019-11-09 LAB — ETHANOL: Alcohol, Ethyl (B): <10 mg/dL (ref <10)

## 2019-11-09 LAB — LIPASE, BLOOD: Lipase: 22 U/L (ref 11–51)

## 2019-11-09 LAB — ACETAMINOPHEN LEVEL: Acetaminophen (Tylenol), Serum: <10 ug/mL — ABNORMAL LOW (ref 10–30)

## 2019-11-09 MED ORDER — ONDANSETRON 4 MG PO TBDP: 4.0000 mg | ORAL_TABLET | Freq: Four times a day (QID) | ORAL | 0 refills | Status: AC | PRN

## 2019-11-09 NOTE — ED Notes (Signed)
Pt to US.

## 2019-11-09 NOTE — Discharge Instructions (Addendum)
Your labs and head CT today were normal other than elevated AST and ALT which are liver function test.  I recommend that you avoid Tylenol and alcohol for the next month and please follow-up with your primary care provider in 1 to 2 weeks to have these rechecked.  Ultrasound of your gallbladder and liver were normal.  I recommend that you hold your atorvastatin  (Lipitor) at this time.  You may resume when instructed by your primary care provider.    You may use ibuprofen 800 mg every 8 hours as needed for fever, body aches, headache.  The symptoms you are experiencing are symptoms that we see frequently after COVID-19 vaccination.  Your other blood work was reassuring.  You had normal platelet count today.  I recommend bland diet for the next several days.  You may use over-the-counter Imodium for diarrhea.  We are discharging you with Zofran that she may take as needed for nausea and vomiting.

## 2019-11-09 NOTE — ED Notes (Signed)
Pt drinking

## 2019-12-12 DIAGNOSIS — R7401 Elevation of levels of liver transaminase levels: Secondary | ICD-10-CM | POA: Diagnosis not present

## 2019-12-12 DIAGNOSIS — R600 Localized edema: Secondary | ICD-10-CM | POA: Diagnosis not present

## 2020-03-04 DIAGNOSIS — R4020 Unspecified coma: Secondary | ICD-10-CM | POA: Diagnosis not present

## 2020-03-04 DIAGNOSIS — R569 Unspecified convulsions: Secondary | ICD-10-CM | POA: Diagnosis not present

## 2020-03-04 DIAGNOSIS — I10 Essential (primary) hypertension: Secondary | ICD-10-CM | POA: Diagnosis not present

## 2024-02-07 ENCOUNTER — Emergency Department (HOSPITAL_COMMUNITY)
Admission: EM | Admit: 2024-02-07 | Discharge: 2024-02-08 | Disposition: A | Discharge status: Home / Self Care | Attending: Emergency Medicine | Admitting: Emergency Medicine

## 2024-02-07 DIAGNOSIS — Z79899 Other long term (current) drug therapy: Secondary | ICD-10-CM | POA: Diagnosis not present

## 2024-02-07 DIAGNOSIS — I10 Essential (primary) hypertension: Secondary | ICD-10-CM | POA: Insufficient documentation

## 2024-02-07 DIAGNOSIS — Z87891 Personal history of nicotine dependence: Secondary | ICD-10-CM | POA: Insufficient documentation

## 2024-02-07 DIAGNOSIS — Z7982 Long term (current) use of aspirin: Secondary | ICD-10-CM | POA: Insufficient documentation

## 2024-02-07 DIAGNOSIS — R079 Chest pain, unspecified: Secondary | ICD-10-CM | POA: Insufficient documentation

## 2024-02-07 DIAGNOSIS — J45909 Unspecified asthma, uncomplicated: Secondary | ICD-10-CM | POA: Insufficient documentation

## 2024-02-08 ENCOUNTER — Encounter (HOSPITAL_COMMUNITY): Payer: Self-pay | Admitting: *Deleted

## 2024-02-08 ENCOUNTER — Other Ambulatory Visit: Payer: Self-pay

## 2024-02-08 ENCOUNTER — Emergency Department (HOSPITAL_COMMUNITY)

## 2024-02-08 LAB — CBC
HCT: 46.2 % (ref 39.0–52.0)
Hemoglobin: 14.9 g/dL (ref 13.0–17.0)
MCH: 27.6 pg (ref 26.0–34.0)
MCHC: 32.3 g/dL (ref 30.0–36.0)
MCV: 85.6 fL (ref 80.0–100.0)
Platelets: 228 K/uL (ref 150–400)
RBC: 5.4 MIL/uL (ref 4.22–5.81)
RDW: 13.6 % (ref 11.5–15.5)
WBC: 6.6 K/uL (ref 4.0–10.5)
nRBC: 0 % (ref 0.0–0.2)

## 2024-02-08 LAB — BASIC METABOLIC PANEL WITH GFR
Anion gap: 12 (ref 5–15)
BUN: 15 mg/dL (ref 6–20)
CO2: 21 mmol/L — ABNORMAL LOW (ref 22–32)
Calcium: 9 mg/dL (ref 8.9–10.3)
Chloride: 107 mmol/L (ref 98–111)
Creatinine, Ser: 1.24 mg/dL (ref 0.61–1.24)
GFR, Estimated: >60 mL/min (ref >60)
Glucose, Bld: 102 mg/dL — ABNORMAL HIGH (ref 70–99)
Potassium: 3.8 mmol/L (ref 3.5–5.1)
Sodium: 140 mmol/L (ref 135–145)

## 2024-02-08 LAB — TROPONIN I (HIGH SENSITIVITY)
Troponin I (High Sensitivity): 7 ng/L (ref <18)
Troponin I (High Sensitivity): 6 ng/L (ref <18)

## 2024-02-08 NOTE — Discharge Instructions (Signed)
 You were seen this evening for chest pain and your workup was reassuring.  I have placed a referral to cardiology for a follow-up appointment.  They should contact you to schedule the appointment.  If you develop further episodes of chest pain or other life-threatening symptoms please return to the emergency department.

## 2024-02-08 NOTE — ED Triage Notes (Signed)
 The pt had an episode 1000 this am of chest tightness sob and tachycardia   and then again 1100 tonight the episode  started again the same as yesterday am. Chest tightness sweating with tachycardia  and shortness of breath

## 2024-02-08 NOTE — ED Notes (Signed)
 This RN reviewed discharge instructions with patient. He verbalized understanding and denied any further questions. PT well appearing upon discharge and reports no pain. Pt ambulated with stable gait to exit. Pt endorses ride home.

## 2024-02-08 NOTE — ED Provider Notes (Signed)
 [place] EMERGENCY DEPARTMENT AT [facility] Provider Note   CSN: [id number] Arrival date & time: 02/07/24  2352     Patient presents with: Chest Pain, Shortness of Breath, and Tachycardia   Charles Ruiz is a 58 y.o. male.  Patient with past medical history significant for hypertension, hyperlipidemia, asthma, seizure history presents to the emergency room complaining of chest tightness with associated shortness of breath.  He states the first episode happened at 10 AM on Sunday morning.  This resolved on its own in 10 minutes time.  He states this evening at 11 PM he had a second episode of similar duration which resolved on its own.  The patient is currently asymptomatic.  He does endorse increased stress due to a difficult separation but denies any triggering events prior to the onset of symptoms today.      Chest Pain Associated symptoms: shortness of breath   Shortness of Breath Associated symptoms: chest pain        Prior to Admission medications   Medication Sig Start Date End Date Taking? Authorizing Provider  albuterol  (PROVENTIL  HFA;VENTOLIN  HFA) 108 (90 Base) MCG/ACT inhaler Inhale 2 puffs into the lungs every 4 (four) hours as needed for wheezing or shortness of breath. 12/11/16   Jason Leita Blush, MD  amLODipine  (NORVASC ) 10 MG tablet Take 10 mg by mouth daily. 11/01/19   [provider]  Aspirin-Salicylamide-Caffeine (BC HEADACHE POWDER PO) Take 1 packet by mouth as needed (for headaches).     [provider]  atorvastatin (LIPITOR) 20 MG tablet Take 20 mg by mouth daily. 10/28/19   [provider]  DULERA 200-5 MCG/ACT AERO Inhale 2 puffs into the lungs 2 (two) times daily. 08/21/19   [provider]  indomethacin (INDOCIN) 50 MG capsule Take 50 mg by mouth 2 (two) times daily as needed (for gout flares).  06/17/19   [provider]  ondansetron  (ZOFRAN  ODT) 4 MG disintegrating tablet Take 1 tablet (4 mg total) by  mouth every 6 (six) hours as needed. 11/09/19   Ward, Josette SAILOR, DO    Allergies: Patient has no known allergies.    Review of Systems  Respiratory:  Positive for shortness of breath.   Cardiovascular:  Positive for chest pain.    Updated Vital Signs BP 119/81   Pulse 60   Temp 98.3 F (36.8 C)   Resp 18   Ht 5' 11 (1.803 m)   Wt 75.8 kg   SpO2 100%   BMI 23.31 kg/m   Physical Exam Vitals and nursing note reviewed.  Constitutional:      General: He is not in acute distress.    Appearance: He is well-developed.  HENT:     Head: Normocephalic and atraumatic.  Eyes:     Conjunctiva/sclera: Conjunctivae normal.  Cardiovascular:     Rate and Rhythm: Normal rate and regular rhythm.     Heart sounds: No murmur heard. Pulmonary:     Effort: Pulmonary effort is normal. No respiratory distress.     Breath sounds: Normal breath sounds.  Chest:     Chest wall: No tenderness.  Abdominal:     Palpations: Abdomen is soft.     Tenderness: There is no abdominal tenderness.  Musculoskeletal:        General: No swelling.     Cervical back: Neck supple.     Right lower leg: No edema.     Left lower leg: No edema.  Skin:  General: Skin is warm and dry.     Capillary Refill: Capillary refill takes less than 2 seconds.  Neurological:     Mental Status: He is alert.  Psychiatric:        Mood and Affect: Mood normal.     (all labs ordered are listed, but only abnormal results are displayed) Labs Reviewed  BASIC METABOLIC PANEL WITH GFR - Abnormal; Notable for the following components:      Result Value   CO2 21 (*)    Glucose, Bld 102 (*)    All other components within normal limits  CBC  TROPONIN I (HIGH SENSITIVITY)  TROPONIN I (HIGH SENSITIVITY)    EKG: EKG Interpretation Date/Time:  Monday February 08 2024 00:07:17 EDT Ventricular Rate:  92 PR Interval:  164 QRS Duration:  78 QT Interval:  352 QTC Calculation: 435 R Axis:   78  Text Interpretation: Normal sinus  rhythm Nonspecific T wave abnormality Abnormal ECG When compared with ECG of 19-Apr-2018 06:02, HEART RATE has decreased Premature ventricular complexes are no longer present Confirmed by Raford Lenis ([id number]) on 02/08/2024 12:15:35 AM  Radiology: [facility] Chest 2 View Result Date: 02/08/2024 CLINICAL DATA:  Chest tightness with shortness of breath EXAM: CHEST - 2 VIEW COMPARISON:  07/18/2022 FINDINGS: The heart size and mediastinal contours are within normal limits. Both lungs are clear. The visualized skeletal structures are unremarkable. IMPRESSION: No active cardiopulmonary disease. Electronically Signed   By: Oneil Devonshire M.D.   On: 02/08/2024 00:52     Procedures   Medications Ordered in the ED - No data to display                                  Medical Decision Making  This patient presents to the ED for concern of chest pain, this involves an extensive number of treatment options, and is a complaint that carries with it a high risk of complications and morbidity.  The differential diagnosis includes ACS, pneumonia, musculoskeletal pain, anxiety, PE, dissection, other   Co morbidities / Chronic conditions that complicate the patient evaluation  Hypertension, hyperlipidemia   Additional history obtained:  Additional history obtained from EMR   Lab Tests:  I Ordered, and personally interpreted labs.  The pertinent results include: Grossly unremarkable CBC, BMP; initial troponin 7, repeat troponin of 6   Imaging Studies ordered:  I ordered imaging studies including chest x-ray I independently visualized and interpreted imaging which showed no active disease I agree with the radiologist interpretation   Cardiac Monitoring: / EKG:  The patient was maintained on a cardiac monitor.  I personally viewed and interpreted the cardiac monitored which showed an underlying rhythm of: Normal sinus rhythm   Social Determinants of Health:  Patient is a former smoker   Test /  Admission - Considered:  Patient with negative troponins x 2, nonischemic EKG.  Presentation not consistent with ACS.  Patient has no shortness of breath, no tachycardia at this time.  He has no chest pain at this time.  No clinical signs of a pulmonary embolism.  Chest x-ray with no acute findings.  Patient is asymptomatic at this time.  No clinical suspicion of a dissection at this time with patient being completely pain-free.  Plan to have patient follow-up with cardiology for further evaluation.  I will provide a referral to cardiology for the same.  I have explained to the patient that his workup  is reassuring at this time but have provided return precautions including repeat chest pain.  The patient voices understanding with return precautions and is comfortable with discharge home.      Final diagnoses:  Chest pain, unspecified type    ED Discharge Orders          Ordered    Ambulatory referral to Cardiology       Comments: If you have not heard from the Cardiology office within the next 72 hours please call [phone].   02/08/24 0323               Logan Ubaldo NOVAK, PA-C 02/08/24 0323    Raford Lenis, MD 02/08/24 [phone]

## 2024-02-08 NOTE — ED Notes (Signed)
 Provider at bedside chatting.
